# Patient Record
Sex: Male | Born: 1945 | Race: White | Hispanic: No | Marital: Married | Smoking: Current every day smoker
Health system: Southern US, Community
[De-identification: ages and names within clinical notes are randomized; demographics above are authoritative.]

## PROBLEM LIST (undated history)

## (undated) DIAGNOSIS — D469 Myelodysplastic syndrome, unspecified: Secondary | ICD-10-CM

## (undated) DIAGNOSIS — I10 Essential (primary) hypertension: Secondary | ICD-10-CM

## (undated) DIAGNOSIS — I429 Cardiomyopathy, unspecified: Secondary | ICD-10-CM

## (undated) DIAGNOSIS — I482 Chronic atrial fibrillation, unspecified: Secondary | ICD-10-CM

## (undated) DIAGNOSIS — C439 Malignant melanoma of skin, unspecified: Secondary | ICD-10-CM

## (undated) DIAGNOSIS — E119 Type 2 diabetes mellitus without complications: Secondary | ICD-10-CM

---

## 2016-12-30 ENCOUNTER — Inpatient Hospital Stay: Payer: Managed Care, Other (non HMO)

## 2016-12-30 ENCOUNTER — Other Ambulatory Visit: Payer: Self-pay

## 2016-12-30 ENCOUNTER — Emergency Department: Payer: Managed Care, Other (non HMO)

## 2016-12-30 ENCOUNTER — Inpatient Hospital Stay
Admission: EM | Admit: 2016-12-30 | Discharge: 2017-01-12 | DRG: 870 | Disposition: E | Payer: Managed Care, Other (non HMO) | Attending: Internal Medicine | Admitting: Internal Medicine

## 2016-12-30 ENCOUNTER — Encounter: Payer: Self-pay | Admitting: Intensive Care

## 2016-12-30 DIAGNOSIS — A419 Sepsis, unspecified organism: Secondary | ICD-10-CM | POA: Diagnosis not present

## 2016-12-30 DIAGNOSIS — R6521 Severe sepsis with septic shock: Secondary | ICD-10-CM | POA: Diagnosis present

## 2016-12-30 DIAGNOSIS — N171 Acute kidney failure with acute cortical necrosis: Secondary | ICD-10-CM | POA: Diagnosis not present

## 2016-12-30 DIAGNOSIS — Z7982 Long term (current) use of aspirin: Secondary | ICD-10-CM

## 2016-12-30 DIAGNOSIS — Z452 Encounter for adjustment and management of vascular access device: Secondary | ICD-10-CM

## 2016-12-30 DIAGNOSIS — K7689 Other specified diseases of liver: Secondary | ICD-10-CM | POA: Diagnosis present

## 2016-12-30 DIAGNOSIS — Z79899 Other long term (current) drug therapy: Secondary | ICD-10-CM

## 2016-12-30 DIAGNOSIS — D61818 Other pancytopenia: Secondary | ICD-10-CM | POA: Diagnosis present

## 2016-12-30 DIAGNOSIS — Z794 Long term (current) use of insulin: Secondary | ICD-10-CM

## 2016-12-30 DIAGNOSIS — D469 Myelodysplastic syndrome, unspecified: Secondary | ICD-10-CM | POA: Diagnosis present

## 2016-12-30 DIAGNOSIS — R7989 Other specified abnormal findings of blood chemistry: Secondary | ICD-10-CM | POA: Diagnosis not present

## 2016-12-30 DIAGNOSIS — B9620 Unspecified Escherichia coli [E. coli] as the cause of diseases classified elsewhere: Secondary | ICD-10-CM

## 2016-12-30 DIAGNOSIS — I4891 Unspecified atrial fibrillation: Secondary | ICD-10-CM

## 2016-12-30 DIAGNOSIS — R7881 Bacteremia: Secondary | ICD-10-CM

## 2016-12-30 DIAGNOSIS — R34 Anuria and oliguria: Secondary | ICD-10-CM | POA: Diagnosis present

## 2016-12-30 DIAGNOSIS — Z66 Do not resuscitate: Secondary | ICD-10-CM | POA: Diagnosis present

## 2016-12-30 DIAGNOSIS — F17290 Nicotine dependence, other tobacco product, uncomplicated: Secondary | ICD-10-CM | POA: Diagnosis present

## 2016-12-30 DIAGNOSIS — Z515 Encounter for palliative care: Secondary | ICD-10-CM | POA: Diagnosis not present

## 2016-12-30 DIAGNOSIS — J9811 Atelectasis: Secondary | ICD-10-CM | POA: Diagnosis present

## 2016-12-30 DIAGNOSIS — A4159 Other Gram-negative sepsis: Secondary | ICD-10-CM | POA: Diagnosis present

## 2016-12-30 DIAGNOSIS — E1165 Type 2 diabetes mellitus with hyperglycemia: Secondary | ICD-10-CM | POA: Diagnosis present

## 2016-12-30 DIAGNOSIS — G9340 Encephalopathy, unspecified: Secondary | ICD-10-CM | POA: Diagnosis present

## 2016-12-30 DIAGNOSIS — I429 Cardiomyopathy, unspecified: Secondary | ICD-10-CM | POA: Diagnosis present

## 2016-12-30 DIAGNOSIS — R17 Unspecified jaundice: Secondary | ICD-10-CM

## 2016-12-30 DIAGNOSIS — N170 Acute kidney failure with tubular necrosis: Secondary | ICD-10-CM | POA: Diagnosis present

## 2016-12-30 DIAGNOSIS — E875 Hyperkalemia: Secondary | ICD-10-CM | POA: Diagnosis present

## 2016-12-30 DIAGNOSIS — I9589 Other hypotension: Secondary | ICD-10-CM | POA: Diagnosis not present

## 2016-12-30 DIAGNOSIS — E872 Acidosis: Secondary | ICD-10-CM | POA: Diagnosis present

## 2016-12-30 DIAGNOSIS — R6520 Severe sepsis without septic shock: Secondary | ICD-10-CM

## 2016-12-30 DIAGNOSIS — I482 Chronic atrial fibrillation, unspecified: Secondary | ICD-10-CM

## 2016-12-30 DIAGNOSIS — I110 Hypertensive heart disease with heart failure: Secondary | ICD-10-CM | POA: Diagnosis present

## 2016-12-30 DIAGNOSIS — L8990 Pressure ulcer of unspecified site, unspecified stage: Secondary | ICD-10-CM | POA: Insufficient documentation

## 2016-12-30 DIAGNOSIS — N390 Urinary tract infection, site not specified: Secondary | ICD-10-CM | POA: Diagnosis present

## 2016-12-30 DIAGNOSIS — N179 Acute kidney failure, unspecified: Secondary | ICD-10-CM | POA: Diagnosis not present

## 2016-12-30 DIAGNOSIS — I509 Heart failure, unspecified: Secondary | ICD-10-CM | POA: Diagnosis present

## 2016-12-30 DIAGNOSIS — Z22322 Carrier or suspected carrier of Methicillin resistant Staphylococcus aureus: Secondary | ICD-10-CM

## 2016-12-30 DIAGNOSIS — R0603 Acute respiratory distress: Secondary | ICD-10-CM | POA: Diagnosis not present

## 2016-12-30 DIAGNOSIS — J9601 Acute respiratory failure with hypoxia: Secondary | ICD-10-CM | POA: Diagnosis present

## 2016-12-30 DIAGNOSIS — K760 Fatty (change of) liver, not elsewhere classified: Secondary | ICD-10-CM | POA: Diagnosis present

## 2016-12-30 DIAGNOSIS — Z7901 Long term (current) use of anticoagulants: Secondary | ICD-10-CM

## 2016-12-30 DIAGNOSIS — J9600 Acute respiratory failure, unspecified whether with hypoxia or hypercapnia: Secondary | ICD-10-CM

## 2016-12-30 DIAGNOSIS — Z85820 Personal history of malignant melanoma of skin: Secondary | ICD-10-CM

## 2016-12-30 HISTORY — DX: Myelodysplastic syndrome, unspecified: D46.9

## 2016-12-30 HISTORY — DX: Essential (primary) hypertension: I10

## 2016-12-30 HISTORY — DX: Cardiomyopathy, unspecified: I42.9

## 2016-12-30 HISTORY — DX: Chronic atrial fibrillation, unspecified: I48.20

## 2016-12-30 HISTORY — DX: Type 2 diabetes mellitus without complications: E11.9

## 2016-12-30 HISTORY — DX: Malignant melanoma of skin, unspecified: C43.9

## 2016-12-30 LAB — BLOOD GAS, ARTERIAL
ALLENS TEST (PASS/FAIL): POSITIVE — AB
Acid-base deficit: 3.2 mmol/L — ABNORMAL HIGH (ref 0.0–2.0)
BICARBONATE: 20.6 mmol/L (ref 20.0–28.0)
MECHVT: 500 mL
O2 Saturation: 99.6 %
PATIENT TEMPERATURE: 37
PEEP/CPAP: 5 cmH2O
PO2 ART: 174 mmHg — AB (ref 83.0–108.0)
RATE: 20 resp/min
pCO2 arterial: 31 mmHg — ABNORMAL LOW (ref 32.0–48.0)
pH, Arterial: 7.43 (ref 7.350–7.450)

## 2016-12-30 LAB — URINALYSIS, COMPLETE (UACMP) WITH MICROSCOPIC
Glucose, UA: 50 mg/dL — AB
KETONES UR: NEGATIVE mg/dL
Leukocytes, UA: NEGATIVE
Nitrite: NEGATIVE
PROTEIN: 100 mg/dL — AB
Specific Gravity, Urine: 1.016 (ref 1.005–1.030)
Squamous Epithelial / LPF: NONE SEEN
pH: 5 (ref 5.0–8.0)

## 2016-12-30 LAB — INFLUENZA PANEL BY PCR (TYPE A & B)
INFLAPCR: NEGATIVE
Influenza B By PCR: NEGATIVE

## 2016-12-30 LAB — BLOOD GAS, VENOUS
ACID-BASE DEFICIT: 1.8 mmol/L (ref 0.0–2.0)
BICARBONATE: 23.1 mmol/L (ref 20.0–28.0)
PATIENT TEMPERATURE: 37
pCO2, Ven: 39 mmHg — ABNORMAL LOW (ref 44.0–60.0)
pH, Ven: 7.38 (ref 7.250–7.430)
pO2, Ven: <31 mmHg — CL (ref 32.0–45.0)

## 2016-12-30 LAB — LACTIC ACID, PLASMA
LACTIC ACID, VENOUS: 3.1 mmol/L — AB (ref 0.5–1.9)
LACTIC ACID, VENOUS: 2.5 mmol/L — AB (ref 0.5–1.9)

## 2016-12-30 LAB — COMPREHENSIVE METABOLIC PANEL
ALT: 497 U/L — ABNORMAL HIGH (ref 17–63)
ANION GAP: 11 (ref 5–15)
AST: 106 U/L — ABNORMAL HIGH (ref 15–41)
Albumin: 2.5 g/dL — ABNORMAL LOW (ref 3.5–5.0)
Alkaline Phosphatase: 71 U/L (ref 38–126)
BUN: 65 mg/dL — ABNORMAL HIGH (ref 6–20)
CALCIUM: 8.6 mg/dL — AB (ref 8.9–10.3)
CO2: 22 mmol/L (ref 22–32)
Chloride: 99 mmol/L — ABNORMAL LOW (ref 101–111)
Creatinine, Ser: 1.49 mg/dL — ABNORMAL HIGH (ref 0.61–1.24)
GFR calc non Af Amer: 46 mL/min — ABNORMAL LOW (ref >60)
GFR, EST AFRICAN AMERICAN: 53 mL/min — AB (ref >60)
Glucose, Bld: 251 mg/dL — ABNORMAL HIGH (ref 65–99)
Potassium: 4.7 mmol/L (ref 3.5–5.1)
SODIUM: 132 mmol/L — AB (ref 135–145)
Total Bilirubin: 18.9 mg/dL (ref 0.3–1.2)
Total Protein: 5.8 g/dL — ABNORMAL LOW (ref 6.5–8.1)

## 2016-12-30 LAB — LIPASE, BLOOD: LIPASE: 18 U/L (ref 11–51)

## 2016-12-30 LAB — TYPE AND SCREEN
ABO/RH(D): A POS
ANTIBODY SCREEN: NEGATIVE

## 2016-12-30 LAB — GLUCOSE, CAPILLARY
GLUCOSE-CAPILLARY: 269 mg/dL — AB (ref 65–99)
GLUCOSE-CAPILLARY: 321 mg/dL — AB (ref 65–99)

## 2016-12-30 LAB — PROTIME-INR
INR: 2.4
Prothrombin Time: 26.6 seconds — ABNORMAL HIGH (ref 11.4–15.2)

## 2016-12-30 LAB — BILIRUBIN, DIRECT: BILIRUBIN DIRECT: 9.7 mg/dL — AB (ref 0.1–0.5)

## 2016-12-30 LAB — TROPONIN I
Troponin I: <0.03 ng/mL (ref <0.03)
Troponin I: <0.03 ng/mL (ref <0.03)

## 2016-12-30 LAB — PROCALCITONIN
PROCALCITONIN: 7.35 ng/mL
Procalcitonin: 4.74 ng/mL

## 2016-12-30 LAB — PATHOLOGIST SMEAR REVIEW

## 2016-12-30 LAB — BRAIN NATRIURETIC PEPTIDE: B Natriuretic Peptide: 2471 pg/mL — ABNORMAL HIGH (ref 0.0–100.0)

## 2016-12-30 MED ORDER — FENTANYL 2500MCG IN NS 250ML (10MCG/ML) PREMIX INFUSION
25.0000 ug/h | INTRAVENOUS | Status: DC
  Administered 2016-12-30 – 2016-12-31 (×2): 100 ug/h via INTRAVENOUS
  Administered 2017-01-02: 400 ug/h via INTRAVENOUS
  Administered 2017-01-02: 125 ug/h via INTRAVENOUS
  Administered 2017-01-03 – 2017-01-05 (×8): 400 ug/h via INTRAVENOUS
  Filled 2016-12-30 (×13): qty 250

## 2016-12-30 MED ORDER — SODIUM CHLORIDE 0.9 % IV SOLN
0.0000 ug/min | INTRAVENOUS | Status: DC
  Administered 2016-12-30 – 2016-12-31 (×2): 65 ug/min via INTRAVENOUS
  Administered 2016-12-31: 70 ug/min via INTRAVENOUS
  Administered 2017-01-01: 75 ug/min via INTRAVENOUS
  Administered 2017-01-01 (×2): 80 ug/min via INTRAVENOUS
  Filled 2016-12-30 (×8): qty 4

## 2016-12-30 MED ORDER — SODIUM CHLORIDE 0.9 % IV SOLN
250.0000 mL | INTRAVENOUS | Status: DC | PRN
  Administered 2016-12-30: 1000 mL via INTRAVENOUS

## 2016-12-30 MED ORDER — ACETAMINOPHEN 650 MG RE SUPP
650.0000 mg | Freq: Once | RECTAL | Status: AC
  Administered 2016-12-30: 650 mg via RECTAL
  Filled 2016-12-30: qty 1

## 2016-12-30 MED ORDER — AMIODARONE HCL IN DEXTROSE 360-4.14 MG/200ML-% IV SOLN
60.0000 mg/h | INTRAVENOUS | Status: DC
  Administered 2016-12-30 – 2017-01-02 (×7): 30 mg/h via INTRAVENOUS
  Administered 2017-01-02: 60 mg/h via INTRAVENOUS
  Administered 2017-01-02: 30 mg/h via INTRAVENOUS
  Administered 2017-01-03 – 2017-01-05 (×9): 60 mg/h via INTRAVENOUS
  Filled 2016-12-30 (×16): qty 200

## 2016-12-30 MED ORDER — HYDROCORTISONE NA SUCCINATE PF 100 MG IJ SOLR
100.0000 mg | Freq: Three times a day (TID) | INTRAMUSCULAR | Status: DC
  Administered 2016-12-30 – 2017-01-05 (×17): 100 mg via INTRAVENOUS
  Filled 2016-12-30 (×17): qty 2

## 2016-12-30 MED ORDER — HYDROCORTISONE NA SUCCINATE PF 100 MG IJ SOLR: 50.0000 mg | Freq: Four times a day (QID) | INTRAMUSCULAR | Status: DC

## 2016-12-30 MED ORDER — SODIUM CHLORIDE 0.9 % IV SOLN
0.0000 ug/min | INTRAVENOUS | Status: DC
  Administered 2016-12-30: 75 ug/min via INTRAVENOUS
  Administered 2016-12-30: 20 ug/min via INTRAVENOUS
  Administered 2016-12-30: 65 ug/min via INTRAVENOUS
  Filled 2016-12-30 (×4): qty 1

## 2016-12-30 MED ORDER — PROPOFOL 1000 MG/100ML IV EMUL
5.0000 ug/kg/min | Freq: Once | INTRAVENOUS | Status: AC
  Administered 2016-12-30: 5 ug/kg/min via INTRAVENOUS
  Filled 2016-12-30: qty 100

## 2016-12-30 MED ORDER — SODIUM BICARBONATE 8.4 % IV SOLN
150.0000 meq | Freq: Once | INTRAVENOUS | Status: AC
  Administered 2016-12-30: 150 meq via INTRAVENOUS
  Filled 2016-12-30: qty 150

## 2016-12-30 MED ORDER — FENTANYL CITRATE (PF) 100 MCG/2ML IJ SOLN: 50.0000 ug | Freq: Once | INTRAMUSCULAR | Status: DC

## 2016-12-30 MED ORDER — FENTANYL CITRATE (PF) 100 MCG/2ML IJ SOLN: 50.0000 ug | INTRAMUSCULAR | Status: DC | PRN

## 2016-12-30 MED ORDER — CHLORHEXIDINE GLUCONATE 0.12% ORAL RINSE (MEDLINE KIT)
15.0000 mL | Freq: Two times a day (BID) | OROMUCOSAL | Status: DC
  Administered 2016-12-30 – 2017-01-01 (×5): 15 mL via OROMUCOSAL

## 2016-12-30 MED ORDER — NOREPINEPHRINE BITARTRATE 1 MG/ML IV SOLN
0.0000 ug/min | INTRAVENOUS | Status: AC
  Administered 2016-12-30: 30 ug/min via INTRAVENOUS
  Filled 2016-12-30: qty 4

## 2016-12-30 MED ORDER — SODIUM CHLORIDE 0.9 % IV SOLN
25.0000 ug/h | INTRAVENOUS | Status: DC
  Filled 2016-12-30: qty 50

## 2016-12-30 MED ORDER — AMIODARONE HCL IN DEXTROSE 360-4.14 MG/200ML-% IV SOLN
60.0000 mg/h | INTRAVENOUS | Status: AC
  Administered 2016-12-30: 60 mg/h via INTRAVENOUS
  Filled 2016-12-30 (×2): qty 200

## 2016-12-30 MED ORDER — FENTANYL CITRATE (PF) 100 MCG/2ML IJ SOLN
50.0000 ug | INTRAMUSCULAR | Status: DC | PRN
  Administered 2016-12-31: 50 ug via INTRAVENOUS

## 2016-12-30 MED ORDER — FAMOTIDINE IN NACL 20-0.9 MG/50ML-% IV SOLN: 20.0000 mg | Freq: Two times a day (BID) | INTRAVENOUS | Status: DC

## 2016-12-30 MED ORDER — AMIODARONE IV BOLUS ONLY 150 MG/100ML
150.0000 mg | Freq: Once | INTRAVENOUS | Status: AC
  Administered 2016-12-30: 150 mg via INTRAVENOUS

## 2016-12-30 MED ORDER — KETAMINE HCL 10 MG/ML IJ SOLN
150.0000 mg | Freq: Once | INTRAMUSCULAR | Status: AC
  Administered 2016-12-30: 150 mg via INTRAVENOUS

## 2016-12-30 MED ORDER — DEXTROSE 5 % IV SOLN
2.0000 g | Freq: Two times a day (BID) | INTRAVENOUS | Status: DC
  Administered 2016-12-30: 2 g via INTRAVENOUS
  Filled 2016-12-30 (×3): qty 2

## 2016-12-30 MED ORDER — DOPAMINE-DEXTROSE 3.2-5 MG/ML-% IV SOLN
INTRAVENOUS | Status: AC
  Filled 2016-12-30: qty 250

## 2016-12-30 MED ORDER — ACETAMINOPHEN 160 MG/5ML PO SOLN: 1000.0000 mg | Freq: Once | ORAL | Status: DC

## 2016-12-30 MED ORDER — VANCOMYCIN HCL IN DEXTROSE 1-5 GM/200ML-% IV SOLN
1000.0000 mg | Freq: Once | INTRAVENOUS | Status: AC
  Administered 2016-12-30: 1000 mg via INTRAVENOUS
  Filled 2016-12-30: qty 200

## 2016-12-30 MED ORDER — SODIUM CHLORIDE 0.9 % IV BOLUS (SEPSIS)
1000.0000 mL | Freq: Once | INTRAVENOUS | Status: AC
  Administered 2016-12-30: 1000 mL via INTRAVENOUS

## 2016-12-30 MED ORDER — FENTANYL BOLUS VIA INFUSION
25.0000 ug | INTRAVENOUS | Status: DC | PRN
  Administered 2017-01-02 – 2017-01-03 (×3): 25 ug via INTRAVENOUS
  Filled 2016-12-30: qty 25

## 2016-12-30 MED ORDER — AMIODARONE LOAD VIA INFUSION
150.0000 mg | Freq: Once | INTRAVENOUS | Status: DC
  Filled 2016-12-30: qty 83.34

## 2016-12-30 MED ORDER — VASOPRESSIN 20 UNIT/ML IV SOLN
0.0300 [IU]/min | INTRAVENOUS | Status: DC
  Administered 2016-12-30 – 2017-01-04 (×6): 0.03 [IU]/min via INTRAVENOUS
  Filled 2016-12-30 (×7): qty 2

## 2016-12-30 MED ORDER — SODIUM CHLORIDE 0.9 % IV BOLUS (SEPSIS)
500.0000 mL | Freq: Once | INTRAVENOUS | Status: AC
  Administered 2016-12-30: 500 mL via INTRAVENOUS

## 2016-12-30 MED ORDER — NOREPINEPHRINE BITARTRATE 1 MG/ML IV SOLN
0.0000 ug/min | Freq: Once | INTRAVENOUS | Status: AC
  Administered 2016-12-30: 10 ug/min via INTRAVENOUS
  Filled 2016-12-30: qty 4

## 2016-12-30 MED ORDER — SODIUM CHLORIDE 0.9 % IV SOLN
100.0000 ug/h | INTRAVENOUS | Status: DC
  Filled 2016-12-30: qty 50

## 2016-12-30 MED ORDER — NOREPINEPHRINE BITARTRATE 1 MG/ML IV SOLN
0.0000 ug/min | INTRAVENOUS | Status: DC
  Administered 2016-12-30 – 2016-12-31 (×2): 6 ug/min via INTRAVENOUS
  Administered 2017-01-02: 18 ug/min via INTRAVENOUS
  Administered 2017-01-02: 8 ug/min via INTRAVENOUS
  Administered 2017-01-03: 5 ug/min via INTRAVENOUS
  Filled 2016-12-30 (×6): qty 16

## 2016-12-30 MED ORDER — CEFEPIME-DEXTROSE 2 GM/50ML IV SOLR
2.0000 g | Freq: Once | INTRAVENOUS | Status: AC
  Administered 2016-12-30: 2 g via INTRAVENOUS
  Filled 2016-12-30: qty 50

## 2016-12-30 MED ORDER — PROPOFOL 1000 MG/100ML IV EMUL
5.0000 ug/kg/min | INTRAVENOUS | Status: DC
  Administered 2016-12-30: 10 ug/kg/min via INTRAVENOUS
  Administered 2016-12-31: 12 ug/kg/min via INTRAVENOUS
  Administered 2016-12-31: 10 ug/kg/min via INTRAVENOUS
  Administered 2017-01-01 (×2): 15 ug/kg/min via INTRAVENOUS
  Administered 2017-01-02: 20 ug/kg/min via INTRAVENOUS
  Filled 2016-12-30 (×6): qty 100

## 2016-12-30 MED ORDER — INSULIN ASPART 100 UNIT/ML ~~LOC~~ SOLN
0.0000 [IU] | SUBCUTANEOUS | Status: DC
  Administered 2016-12-30: 5 [IU] via SUBCUTANEOUS
  Administered 2016-12-30: 7 [IU] via SUBCUTANEOUS
  Administered 2016-12-31 (×6): 5 [IU] via SUBCUTANEOUS
  Administered 2017-01-01 (×3): 3 [IU] via SUBCUTANEOUS
  Administered 2017-01-01 (×2): 5 [IU] via SUBCUTANEOUS
  Administered 2017-01-01: 3 [IU] via SUBCUTANEOUS
  Filled 2016-12-30: qty 3
  Filled 2016-12-30 (×2): qty 5
  Filled 2016-12-30: qty 3
  Filled 2016-12-30 (×2): qty 5
  Filled 2016-12-30: qty 3
  Filled 2016-12-30 (×3): qty 5
  Filled 2016-12-30: qty 7
  Filled 2016-12-30 (×2): qty 5
  Filled 2016-12-30: qty 3

## 2016-12-30 MED ORDER — ORAL CARE MOUTH RINSE
15.0000 mL | Freq: Four times a day (QID) | OROMUCOSAL | Status: DC
  Administered 2016-12-31 – 2017-01-01 (×8): 15 mL via OROMUCOSAL

## 2016-12-30 MED ORDER — ROCURONIUM BROMIDE 50 MG/5ML IV SOLN
100.0000 mg | Freq: Once | INTRAVENOUS | Status: AC
  Administered 2016-12-30: 100 mg via INTRAVENOUS
  Filled 2016-12-30: qty 10

## 2016-12-30 MED ORDER — VANCOMYCIN HCL IN DEXTROSE 1-5 GM/200ML-% IV SOLN
1000.0000 mg | Freq: Two times a day (BID) | INTRAVENOUS | Status: DC
  Administered 2016-12-30 – 2016-12-31 (×2): 1000 mg via INTRAVENOUS
  Filled 2016-12-30 (×3): qty 200

## 2016-12-30 MED ORDER — KETAMINE HCL 10 MG/ML IJ SOLN
INTRAMUSCULAR | Status: AC
  Administered 2016-12-30: 150 mg via INTRAVENOUS
  Filled 2016-12-30: qty 1

## 2016-12-30 NOTE — Progress Notes (Signed)
Sepsis - Repeat Assessment  Performed at:   16:20   Vitals     Blood pressure 103/78, pulse (!) 113, temperature 99 F (37.2 C), resp. rate 20, height 6\' 1"  (1.854 m), weight 89.4 kg (197 lb), SpO2 99 %.  Heart:     Irregular rate and rhythm  Lungs:    Rhonchi  Capillary Refill:   <2 sec  Peripheral Pulse:   Radial pulse palpable, Dorsalis pedis pulse  dopplerable and Posterior tibialis pulse  dopplerable  Skin:     jaundiced     Sepsis protocol initiated in the ER pt received 3.5L NS bolus in the ER.  Will repeat lactic acid if lactic acid remains elevated will not fluid resuscitate due to cxr revealing severe pulmonary edema and cardiomegaly continue broad spectrum abx.  Marda Stalker, [address] Pager [phone] (please enter 7 digits) PCCM Consult Pager [phone] (please enter 7 digits)

## 2016-12-30 NOTE — ED Triage Notes (Signed)
PAtient [facility] Patient is from [place] and checked himself out the hospital there on Tuesday to come to [place] and watch grandson play ball. Patient arrived into town last night. Wife reports patient became confused on Wednesday and reports he has not eaten since Sunday. HX MDS and A-fib. EMS vitals temp 101, tachy 130s, and blood sugar 307. A&O only to self

## 2016-12-30 NOTE — ED Notes (Signed)
Unable to obtain tni/lact. NP Hinton Dyer aware

## 2016-12-30 NOTE — Progress Notes (Signed)
[facility] Progress Note Patient Name: Xavier King DOB: Jun 29, 1946 MRN: [id number]   Date of Service  12/21/2016  HPI/Events of Note  Patient needs repeat Lactic Acid level for sepsis protocol and volume assessment note.   eICU Interventions  Will order: 1. Lactic Acid now and Q 3 hours X 2.  2. [facility] APP requested to do volume assessment note.      Intervention Category Major Interventions: Sepsis - evaluation and management  Carmelo Reidel Eugene 12/21/2016, 6:10 PM

## 2016-12-30 NOTE — ED Notes (Signed)
Close communication kept with CC Doctor and NP during stay in ED. Patient Stable at time of transfer. Accepting RN at bedside.   Detailed report given to CCU RN. CCU RN voiced no concerns or additional questions.   Patient transition to ICU without complication.

## 2016-12-30 NOTE — ED Notes (Signed)
Contacted Pharm for Neo and  Amio, awaiting pharm approval and shipment

## 2016-12-30 NOTE — ED Provider Notes (Signed)
[facility] Emergency Department Provider Note  ____________________________________________   First MD Initiated Contact with Patient 12/22/2016 1021     (approximate)  I have reviewed the triage vital signs and the nursing notes.   HISTORY  Chief Complaint Altered Mental Status  History Limited by the patient's mental status  HPI Xavier King is a 71 y.o. male is brought to the emergency department with his wife via EMS for altered mental status and shortness of breath. He has a past medical history of myelodysplastic syndrome who was recently admitted to his home hospital in [place] one week ago. He received a blood transfusion and was discharged [place] because he wanted to come to [place] to see his grandson playing a baseball game. His wife reports increasing confusion and shortness of breath for the past 2 days. Further history is limited by the patient's clinical condition.   Past Medical History:  Diagnosis Date  . Atrial fibrillation ([place])   . Diabetes mellitus without complication ([place])   . Hypertension   . MDS (myelodysplastic syndrome) ([place])   . Melanoma ([place])     There are no active problems to display for this patient.   History reviewed. No pertinent surgical history.  Prior to Admission medications   Medication Sig Start Date End Date Taking? Authorizing Provider  acetaminophen (TYLENOL) 325 MG tablet Take 650 mg by mouth every 6 (six) hours as needed.   Yes Historical Provider, MD  alum & mag hydroxide-simeth (MAALOX/MYLANTA) 200-200-20 MG/5ML suspension Take 30 mLs by mouth every 6 (six) hours as needed for indigestion or heartburn.   Yes Historical Provider, MD  apixaban (ELIQUIS) 5 MG TABS tablet Take 5 mg by mouth 2 (two) times daily.   Yes Historical Provider, MD  aspirin EC 81 MG tablet Take 81 mg by mouth daily.   Yes Historical Provider, MD  atorvastatin (LIPITOR) 10 MG tablet Take 10 mg by  mouth daily.   Yes Historical Provider, MD  calcium carbonate (TUMS - DOSED IN MG ELEMENTAL CALCIUM) 500 MG chewable tablet Chew 1 tablet by mouth 4 (four) times daily as needed for indigestion or heartburn.   Yes Historical Provider, MD  cyanocobalamin 1000 MCG tablet Take 1,000 mcg by mouth daily.   Yes Historical Provider, MD  diltiazem (DILTIAZEM CD) 120 MG 24 hr capsule Take 120 mg by mouth daily.   Yes Historical Provider, MD  glyBURIDE (DIABETA) 5 MG tablet Take 10 mg by mouth 2 (two) times daily with a meal.   Yes Historical Provider, MD  guaiFENesin-codeine (VIRTUSSIN A/C) 100-10 MG/5ML syrup Take 10 mLs by mouth 3 (three) times daily as needed for cough.   Yes Historical Provider, MD  insulin aspart (NOVOLOG) 100 UNIT/ML injection Inject into the skin 3 (three) times daily before meals. Sliding scale   Yes Historical Provider, MD  Melatonin 3 MG TABS Take 6 mg by mouth at bedtime as needed.   Yes Historical Provider, MD  metoprolol succinate (TOPROL-XL) 100 MG 24 hr tablet Take 100 mg by mouth daily. Take with or immediately following a meal.   Yes Historical Provider, MD  senna-docusate (SENOKOT-S) 8.6-50 MG tablet Take 1 tablet by mouth 2 (two) times daily as needed for mild constipation.   Yes Historical Provider, MD    Allergies Patient has no known allergies.  History reviewed. No pertinent family history.  Social History Social History  Substance Use Topics  . Smoking status: Current Every Day Smoker  Types: Cigars  . Smokeless tobacco: Never Used     Comment: 1 cigar a day  . Alcohol use No    Review of Systems Level V caveat review of systems cannot be obtained secondary to altered mental status   ____________________________________________   PHYSICAL EXAM:  VITAL SIGNS: ED Triage Vitals [12/29/2016 1016]  Enc Vitals Group     BP 124/81     Pulse Rate (!) 145     Resp 19     Temp 99.8 F (37.7 C)     Temp Source Axillary     SpO2 100 %     Weight       Height      Head Circumference      Peak Flow      Pain Score      Pain Loc      Pain Edu?      Excl. in [place]?     Constitutional: Ill-appearing slight diaphoresis breathing in the 40s confused Eyes: Significant icterus Head: Atraumatic. Nose: No congestion/rhinnorhea. Mouth/Throat: Mucous membranes are moist.  Oropharynx non-erythematous. Neck: No stridor.   Cardiovascular: Irregularly irregular and tachycardic Respiratory: Normal respiratory effort.  No retractions. Lungs CTAB. Gastrointestinal: Soft and nontender. No distention. No abdominal bruits. No CVA tenderness. Musculoskeletal: No lower extremity tenderness nor edema.  No joint effusions. Neurologic:  Alert and oriented to name only confused moving all 4 Skin:  Skin is warm, dry and intact. No rash noted. Psychiatric: Mood and affect are normal. Speech and behavior are normal.  ____________________________________________   LABS (all labs ordered are listed, but only abnormal results are displayed)  Labs Reviewed  COMPREHENSIVE METABOLIC PANEL - Abnormal; Notable for the following:       Result Value   Sodium 132 (*)    Chloride 99 (*)    Glucose, Bld 251 (*)    BUN 65 (*)    Creatinine, Ser 1.49 (*)    Calcium 8.6 (*)    Total Protein 5.8 (*)    Albumin 2.5 (*)    AST 106 (*)    ALT 497 (*)    Total Bilirubin 18.9 (*)    GFR calc non Af Amer 46 (*)    GFR calc Af Amer 53 (*)    All other components within normal limits  CBC WITH DIFFERENTIAL/PLATELET - Abnormal; Notable for the following:    WBC 1.1 (*)    RBC 3.01 (*)    Hemoglobin 9.9 (*)    HCT 28.2 (*)    RDW 22.4 (*)    Platelets 91 (*)    nRBC 858 (*)    Neutro Abs 0.7 (*)    Lymphs Abs 0.3 (*)    Monocytes Absolute 0.1 (*)    All other components within normal limits  LACTIC ACID, PLASMA - Abnormal; Notable for the following:    Lactic Acid, Venous 3.1 (*)    All other components within normal limits  BRAIN NATRIURETIC PEPTIDE - Abnormal;  Notable for the following:    B Natriuretic Peptide 2,471.0 (*)    All other components within normal limits  BLOOD GAS, VENOUS - Abnormal; Notable for the following:    pCO2, Ven 39 (*)    pO2, Ven <31.0 (*)    All other components within normal limits  PROTIME-INR - Abnormal; Notable for the following:    Prothrombin Time 26.6 (*)    All other components within normal limits  URINALYSIS, COMPLETE (UACMP) WITH MICROSCOPIC - Abnormal;  Notable for the following:    Color, Urine AMBER (*)    APPearance CLEAR (*)    Glucose, UA 50 (*)    Hgb urine dipstick MODERATE (*)    Bilirubin Urine MODERATE (*)    Protein, ur 100 (*)    Bacteria, UA MANY (*)    All other components within normal limits  BILIRUBIN, DIRECT - Abnormal; Notable for the following:    Bilirubin, Direct 9.7 (*)    All other components within normal limits  BLOOD GAS, ARTERIAL - Abnormal; Notable for the following:    pCO2 arterial 31 (*)    pO2, Arterial 174 (*)    Acid-base deficit 3.2 (*)    Allens test (pass/fail) POSITIVE (*)    All other components within normal limits  CULTURE, BLOOD (ROUTINE X 2)  CULTURE, BLOOD (ROUTINE X 2)  LIPASE, BLOOD  TROPONIN I  PROCALCITONIN  INFLUENZA PANEL BY PCR (TYPE A & B)  PATHOLOGIST SMEAR REVIEW  URINALYSIS, ROUTINE W REFLEX MICROSCOPIC  LACTIC ACID, PLASMA  TYPE AND SCREEN   ____________________________________________  EKG  ED ECG REPORT I, Darel Hong, the attending physician, personally viewed and interpreted this ECG.  Date: 01/06/2017 Rate: 140 Rhythm: Atrial fibrillation with rapid ventricular response QRS Axis: Right axis deviation Intervals: Normal QTC ST/T Wave abnormalities: Diffuse ST depression throughout Conduction Disturbances: none Narrative Interpretation: Abnormal EKG atrial fibrillation with rapid ventricular response with signs of subendocardial ischemia _________________________________________  RADIOLOGY  ET tube slightly high  evidence of mild fluid overload ____________________________________________   PROCEDURES  Procedure(s) performed: Yes  INTUBATION Performed by: Darel Hong  Required items: required blood products, implants, devices, and special equipment available Patient identity confirmed: provided demographic data and hospital-assigned identification number Time out: Immediately prior to procedure a "time out" was called to verify the correct patient, procedure, equipment, support staff and site/side marked as required.  Indications: Altered mental status and sepsis   Intubation method: Glidescope Laryngoscopy   Preoxygenation: High flow nasal cannula   Sedatives: Ketamine  Paralytic: Rocuronium   Tube Size: 8-0 cuffed  Post-procedure assessment: chest rise and ETCO2 monitor Breath sounds: equal and absent over the epigastrium Tube secured with: ETT holder Chest x-ray interpreted by radiologist and me.  Chest x-ray findings: Endotracheal tube slightly high will be advanced 2 cm   Patient tolerated the procedure well with no immediate complications.     Procedures  Critical Care performed: Yes  CRITICAL CARE Performed by: Darel Hong   Total critical care time: 35 minutes  Critical care time was exclusive of separately billable procedures and treating other patients.  Critical care was necessary to treat or prevent imminent or life-threatening deterioration.  Critical care was time spent personally by me on the following activities: development of treatment plan with patient and/or surrogate as well as nursing, discussions with consultants, evaluation of patient's response to treatment, examination of patient, obtaining history from patient or surrogate, ordering and performing treatments and interventions, ordering and review of laboratory studies, ordering and review of radiographic studies, pulse oximetry and re-evaluation of patient's  condition.   ____________________________________________   INITIAL IMPRESSION / ASSESSMENT AND PLAN / ED COURSE  Pertinent labs & imaging results that were available during my care of the patient were reviewed by me and considered in my medical decision making (see chart for details).  On arrival the patient was tachycardic diaphoretic confused and ill appearing. Febrile to 101 quart temperature. Differential is broad but certainly concerning for sepsis and most  likely hospital associated pneumonia given cough and shortness of breath. I discussed the case with the patient's primary care physician in [place] who indicated his last bilirubin was 3 in January. I spoke with the patient's wife who provided verbal consent for endotracheal intubation given the altered mental status. Patient was intubated without complication on first pass with ketamine and rocuronium and broad-spectrum antibiotics given as well as 30 cc/kg of fluid. Case was discussed with the on-call intensivist Dr. Mortimer Fries who has graciously agreed to admit the patient to his service.     ----------------------------------------- 12:37 PM on 12/29/2016 -----------------------------------------  The patient is now hypotensive to 59/40 so we'll bolus him another liter and begin peripheral norepinephrine.  ____________________________________________   FINAL CLINICAL IMPRESSION(S) / ED DIAGNOSES  Final diagnoses:  Severe sepsis ([place])      NEW MEDICATIONS STARTED DURING THIS VISIT:  New Prescriptions   No medications on file     Note:  This document was prepared using Dragon voice recognition software and may include unintentional dictation errors.     Darel Hong, MD 12/23/2016 [phone]

## 2016-12-30 NOTE — ED Notes (Signed)
Close communication

## 2016-12-30 NOTE — H&P (Signed)
PULMONARY / CRITICAL CARE MEDICINE   Name: Xavier King MRN: [id number] DOB: 18-Jan-1946    ADMISSION DATE:  01/10/2017 CONSULTATION DATE:  01/09/2017  REFERRING MD:  Dr. Mable Paris   CHIEF COMPLAINT:  Altered Mental Status   HISTORY OF PRESENT ILLNESS:   This is a 71 yo male with a PMH of Atrial Fibrillation, Diabetes Mellitus, HTN, Myelodysplastic Syndrome, recent Echo 12/2016 EF 30% to 35%, and Melanoma.  He presented to [facility] ER 02/16 via EMS with Altered Mental Status.  Per ER notes the pt is from [place] and he was recently hospitalized there and left AMA on 02/13 to travel to [place] to watch his grandson play ball he arrived to [place] the night of 02/15.  According to pts wife the pt became confused on 02/14 and has not eaten since 02/11.  Upon EMS arrival at the [facility] in [place] pts. vital signs were temp 101, hr 130's, and blood sugar 302 the pt was alert to himself only. In the ER venous gas revealed the pt was severely hypoxic, BNP elevated 2,471, therefore he required mechanical intubation. It was also noted the pt was in afibb with rvr hr 130-140's with lactic acidosis and an elevated PCT ruling in for sepsis, therefore sepsis protocol initiated pt received total 3.5L NS.  PCCM contacted to admit pt to ICU due to acute hypoxic respiratory failure secondary to CHF, ?HCAP with sepsis, and Afibb with RVR.   PAST MEDICAL HISTORY :  He  has a past medical history of Atrial fibrillation ([place]); Diabetes mellitus without complication ([place]); Hypertension; MDS (myelodysplastic syndrome) ([place]); and Melanoma ([place]).  PAST SURGICAL HISTORY: He  has no past surgical history on file.  No Known Allergies  No current facility-administered medications on file prior to encounter.    No current outpatient prescriptions on file prior to encounter.    FAMILY HISTORY:  His has no family status information on file.    SOCIAL HISTORY: He  reports that he has been smoking Cigars.   He has never used smokeless tobacco. He reports that he does not drink alcohol.  REVIEW OF SYSTEMS:   Unable to assess pt intubated  SUBJECTIVE:  Pt currently sedated and intubated   VITAL SIGNS: BP 107/83   Pulse (!) 143   Temp (!) 101.4 F (38.6 C)   Resp 13   Ht 6\' 1"  (1.854 m)   Wt 89.4 kg (197 lb)   SpO2 100%   BMI 25.99 kg/m   HEMODYNAMICS:    VENTILATOR SETTINGS: Vent Mode: AC FiO2 (%):  [70 %] 70 % Set Rate:  [20 bmp] 20 bmp Vt Set:  [500 mL] 500 mL PEEP:  [5 cmH20] 5 cmH20  INTAKE / OUTPUT: No intake/output data recorded.  PHYSICAL EXAMINATION: General: acutely ill Caucasian male  Neuro: sedated not followings commands, PERRLA  HEENT: supple, mild JVD Cardiovascular: irregular irregular, no M/R/G Lungs: rhonchi and crackles throughout, labored, mechanically intubated  Abdomen: +BS x4, mildly distended, soft  Musculoskeletal: 1+ bilateral lower extremity pitting edema  Skin: generalized jaundice   LABS:  BMET  Recent Labs Lab 12/29/2016 1031  NA 132*  K 4.7  CL 99*  CO2 22  BUN 65*  CREATININE 1.49*  GLUCOSE 251*    Electrolytes  Recent Labs Lab 12/22/2016 1031  CALCIUM 8.6*    CBC  Recent Labs Lab 12/27/2016 1031  WBC 1.1*  HGB 9.9*  HCT 28.2*  PLT 91*    Coag's  Recent Labs Lab 01/06/2017 1031  INR 2.40    Sepsis Markers  Recent Labs Lab 12/20/2016 1023 12/15/2016 1031  LATICACIDVEN 3.1*  --   PROCALCITON  --  4.74    ABG  Recent Labs Lab 12/18/2016 1208  PHART 7.43  PCO2ART 31*  PO2ART 174*    Liver Enzymes  Recent Labs Lab 12/27/2016 1031  AST 106*  ALT 497*  ALKPHOS 71  BILITOT 18.9*  ALBUMIN 2.5*    Cardiac Enzymes  Recent Labs Lab 12/26/2016 1031  TROPONINI <0.03    Glucose No results for input(s): GLUCAP in the last 168 hours.  Imaging Dg Abdomen 1 View  Result Date: 12/31/2016 CLINICAL DATA:  Status post OG tube placement EXAM: ABDOMEN - 1 VIEW COMPARISON:  None. FINDINGS: Gastric  catheter lies at the level of the gastroesophageal junction. Proximal side port is within the distal esophagus. This should be advanced as clinically indicated. Left renal calculus is noted. No obstructive changes are seen. IMPRESSION: Gastric catheter at the GE junction as described. Left renal calculus. Electronically Signed   By: Inez Catalina M.D.   On: 12/29/2016 11:41   Ct Head Wo Contrast  Result Date: 12/16/2016 CLINICAL DATA:  Episode of confusion since Wednesday, history myelodysplastic syndrome, atrial fibrillation, hypertension, diabetes mellitus, prior melanoma EXAM: CT HEAD WITHOUT CONTRAST TECHNIQUE: Contiguous axial images were obtained from the base of the skull through the vertex without intravenous contrast. Sagittal and coronal MPR images reconstructed from axial data set. COMPARISON:  None FINDINGS: Brain: Generalized atrophy. Normal ventricular morphology. No midline shift or mass effect. Minimal small vessel chronic ischemic changes of deep cerebral white matter. No intracranial hemorrhage, mass lesion, evidence of acute infarction, or extra-axial fluid collection. Vascular: Atherosclerotic calcifications at the carotid arteries and vertebral Skull: Demineralized but intact Sinuses/Orbits: Mucosal thickening in ethmoid air cells. Remaining paranasal sinuses and mastoid air cells clear. Other: N/A IMPRESSION: Atrophy with minimal small vessel chronic ischemic changes of deep cerebral white matter. No acute intracranial abnormalities. Electronically Signed   By: Lavonia Dana M.D.   On: 12/16/2016 12:17   Dg Chest Port 1 View  Result Date: 01/01/2017 CLINICAL DATA:  Intubated.  Cough. EXAM: PORTABLE CHEST 1 VIEW COMPARISON:  12/25/2016 FINDINGS: There is an endotracheal tube with the tip 5.8 cm above the carina. There is no pleural effusion or pneumothorax. There is no focal consolidation. There is mild cardiomegaly. There is a nasogastric tube coursing below the diaphragm. The osseous  structures are unremarkable. IMPRESSION: Endotracheal tube with the tip 5.8 cm above the carina. Electronically Signed   By: Kathreen Devoid   On: 12/17/2016 11:38   STUDIES:  CT Head 02/16>>Atrophy with minimal small vessel chronic ischemic changes of deep cerebral white matter. No acute intracranial abnormalities.  CULTURES: Blood 02/16>> Urine 02/16>> Influenza PCR>>negative   ANTIBIOTICS: Vancomycin 02/16>> Cefepime 02/16>>  SIGNIFICANT EVENTS: 02/16-Pt admitted to [facility] ICU with acute hypoxic respiratory failure secondary to CHF, ?HCAP with sepsis, and Afibb with RVR PCCM contacted to admit pt  LINES/TUBES: PIV's 02/16>>  ASSESSMENT / PLAN:  PULMONARY A: Acute hypoxic respiratory failure secondary to CHF and ?HCAP with sepsis Mechanical Intubation P:   Full vent support for now  Maintain O2 sats >92% VAP Bundle Repeat ABG today 02/16 CXR in am   CARDIOVASCULAR A:  Afibb with RVR Septic shock  CHF Cardiomegaly  Hx: Chronic afibb (current outpatient anticoagulation Eliquis) and HTN  P:  Continuous telemetry monitoring  Neo-synephrine gtt to maintain map goal >65 Amiodarone gtt  Cardiology consulted  appreciate input Trend troponin's  Will hold off on diuresis for now due to hypotension  RENAL A:   Acute renal failure likely secondary to hypotension P:   Trend BMP's Replace electrolytes as indicated  Monitor UOP  GASTROINTESTINAL A:   Elevated Bilirubin  Elevated Liver Enzymes P:   Pecid for PUD prophylaxis  Keep NPO for now  OG tube to LIS GI consult pending appreciate input  US Abdomen Limited RUQ pending Repeat liver enzyme and bilirubin levels in the am   HEMATOLOGIC A:   Thrombocytopenia Neutropenia secondary to MDS  Hx: Melanoma and Myelodysplastic syndrome  P:  SCD's for VTE prophylaxis will hold off on mechanical anticoagulation for now  Trend CBC Monitor for s/sx of bleeding Transfuse for hgb <7 Neutropenic  precautions  INFECTIOUS A:   ?HCAP  P:   Trend WBC and monitor fever curve Trend lactic acid and PCT's Follow cultures  Continue abx as listed above   ENDOCRINE A:   Diabetes Mellitus P:   CBG's q4hrs SSI   NEUROLOGIC A:   Acute encephalopathy likely secondary to sepsis  P:   RASS goal: 0 to -1 Propofol gtt and Fentanyl gtt to maintain RASS goal  Prn Fentanyl for pain management   FAMILY  - Updates: Family at bedside and updated regarding plan of care 12/19/2016  - Inter-disciplinary family meet or Palliative Care meeting due by: 2017/01/22  Marda Stalker, [place] Pager [phone] (please enter 7 digits) PCCM Consult Pager [phone] (please enter 7 digits)  Pulmonary/critical care attending note  I have personally seen and examined Mr. Teague, I have reviewed revise and confirm Cindie Laroche note. I have personally reviewed laboratory data and x-ray imaging. In short patient with myelodysplastic syndrome presents with multisystem organ failure to include respiratory failure, renal failure, shock, atrial fibrillation with rapid ventricular response, prior history of congestive heart failure. Patient is intubated, will place on broad-spectrum antibiotic coverage, amiodarone bolus and drip, will start on Neo-Synephrine and may need to add vasopressin in addition. Will obtain echocardiography, cardiology consultation, gastroenterology consultation, repeat liver function tests with right upper quadrant ultrasound to explain the conjugated hyperbilirubinemia. Family is at bedside and has been updated  Hermelinda Dellen, D.O.

## 2016-12-30 NOTE — Progress Notes (Signed)
Patient vitals stable at this time- weaning off pressors as possible.  Levophed now at 43mcqs and neo-synpherine at 68mcqs- HR afib in low 100's.  Notified Dr. Jefferson Fuel that patient's pupils are unequal- right is a 2 and brisk and left is 3 and unreactive- he stated that the patient is too unstable to send for CT of the head- and to relay to the night RN if patient is more stable during the night- that they can send patient for CT scan.

## 2016-12-30 NOTE — ED Notes (Signed)
Marily Memos, NP at bedside, verbal order given to start levophed at 10 mcg/min

## 2016-12-30 NOTE — Procedures (Signed)
Central Venous Catheter Insertion Procedure Note Neylan Landfair [id number] 1946/04/04  Procedure: Insertion of Central Venous Catheter Indications: Assessment of intravascular volume, Drug and/or fluid administration and Frequent blood sampling  Procedure Details Consent: Risks of procedure as well as the alternatives and risks of each were explained to the (patient/caregiver).  Consent for procedure obtained. Time Out: Verified patient identification, verified procedure, site/side was marked, verified correct patient position, special equipment/implants available, medications/allergies/relevent history reviewed, required imaging and test results available.  Performed  Maximum sterile technique was used including antiseptics, cap, gloves, gown, hand hygiene, mask and sheet. Skin prep: Chlorhexidine; local anesthetic administered A antimicrobial bonded/coated triple lumen catheter was placed in the right femoral vein due to multiple attempts, no other available access using the Seldinger technique.  Evaluation Blood flow good Complications: No apparent complications Patient did tolerate procedure well. Chest X-ray ordered to verify placement.  CXR: pending.  Attempted to place left internal jugular central line utilizing ultrasound, however unable to advance guidewire after 2 attempts therefore right femoral central line placed utilizing ultrasound no complications noted during or following procedure cxr pending.  Marda Stalker, [place] Pager [phone] (please enter 7 digits) PCCM Consult Pager [phone] (please enter 7 digits)

## 2016-12-30 NOTE — Progress Notes (Signed)
Pharmacy Antibiotic Note  Xavier King is a 71 y.o. male admitted on 12/21/2016 with HCAP/sepsis. Patient has history significant for myelodysplastic syndrome. Pharmacy has been consulted for cefepime and vancomycin dosing. Patient received vancomycin and cefepime in ED.   Plan: Will continue cefepime 2g IV Q12hr.    Will continue vancomycin 1000mg  IV Q12hr for goal trough of 15-20. Will obtain trough prior to am dose on 2/18.   Height: 6\' 1"  (185.4 cm) Weight: 197 lb (89.4 kg) IBW/kg (Calculated) : 79.9  Temp (24hrs), Avg:100.3 F (37.9 C), Min:99.8 F (37.7 C), Max:103 F (39.4 C)   Recent Labs Lab 12/29/2016 1023 01/02/2017 1031  WBC  --  1.1*  CREATININE  --  1.49*  LATICACIDVEN 3.1*  --     Estimated Creatinine Clearance: 52.9 mL/min (by C-G formula based on SCr of 1.49 mg/dL (H)).    No Known Allergies  Antimicrobials this admission: Cefepime 2/16 >>  Vancomycin 2/16 >>   Dose adjustments this admission: N/A  Microbiology results: 2/16 BCx: pending  2/16 UCx: pending   2/16 MRSA PCR: pending   Pharmacy will continue to monitor and adjust per consult.   Simpson,Michael L 01/10/2017 3:21 PM

## 2016-12-30 NOTE — ED Notes (Signed)
MD Mable Paris and Kinner at bedside with RNs and RT for intubation

## 2016-12-30 NOTE — Consult Note (Signed)
Cardiology Consult    Patient ID: Xavier King MRN: [id number], DOB/AGE: 05-07-1946   Admit date: 12/24/2016 Date of Consult: 12/18/2016  Primary Physician: No PCP Per Patient Primary Cardiologist: New - seen by M. Fletcher Anon, MD  Requesting Provider: Lenna Sciara. Conforti, MD  Patient Profile    71 y/o ? with a reported h/o chronic afib on eliquis, HTN, DMII, cardiomyopathy (? ICM vs NICM), myelodysplastic syndrome, and melanoma, who presented to the [facility] ED on 2/16 with a 2 day h/o confusion with worsening mental status, fever, and afib RVR.  Past Medical History   Past Medical History:  Diagnosis Date  . Cardiomyopathy ([place])    a. 12/2016 Reported echo: EF 30-35%.  . Chronic a-fib ([place])    a. CHA2DS2VASc = 3-->Eliquis.  . Diabetes mellitus without complication ([place])   . Hypertension   . MDS (myelodysplastic syndrome) ([place])   . Melanoma ([place])     History reviewed. No pertinent surgical history.   Allergies  No Known Allergies  History of Present Illness    71 y/o ? with a reported h/o chronic afib and cardiomyopathy.  Other hx includes  HTN, DMII, myelodysplastic syndrome, and melanoma.  He is currently intubated and sedated and unable to participate in the interview.  Hx obtained from prior notes.  He lives in [place] and was recently hospitalized there but left AMA on 2/13 to travel to [place] to see his grandson play ball.  He has been dealing with anorexia and has not eaten since 2/11.  On 2/14, he was noted to be confusted.  This progressed and this AM, he was noted to be disoriented.  EMS was called to the hotel that he is staying @ and he was febrile w/ a temp of 101, tachycardic in 130's, and hypoxic.  He was taken to the ED where BNP was elevated @ 2471.  Lactate and PCT were elevated and thus a code Sepsis was initiated.  Broad spectrum abx have been ordered.  He did require intubation and has also been hypotensive and is currently requiring phenylephrine, norepinephrine, and  vasopressin infusions.  He is in rapid afib in the 130's to 140's and amio has been initiated.  It is believed that his afib is chronic.  We have been asked to eval r/t afib rvr.  Inpatient Medications    . ceFEPime (MAXIPIME) IV  2 g Intravenous Q12H  . DOPamine      . fentaNYL (SUBLIMAZE) injection  50 mcg Intravenous Once  . hydrocortisone sod succinate (SOLU-CORTEF) inj  100 mg Intravenous Q8H  . insulin aspart  0-9 Units Subcutaneous Q4H  . vancomycin  1,000 mg Intravenous Q12H    Family History    Pt is intubated and sedated and not currently able to supply this information.  Social History    Pt is intubated and sedated and not currently able to supply this information.  Social History   Social History  . Marital status: Married    Spouse name: N/A  . Number of children: N/A  . Years of education: N/A   Occupational History  . Not on file.   Social History Main Topics  . Smoking status: Current Every Day Smoker    Types: Cigars  . Smokeless tobacco: Never Used     Comment: 1 cigar a day  . Alcohol use No  . Drug use: Unknown  . Sexual activity: Not on file   Other Topics Concern  . Not on file   Social History  Narrative  . No narrative on file     Review of Systems    Pt is intubated and sedated and not currently able to supply this information.  Physical Exam    Blood pressure (!) 84/69, pulse (!) 120, temperature 100 F (37.8 C), resp. rate (!) 0, height 6\' 1"  (1.854 m), weight 197 lb (89.4 kg), SpO2 100 %.  General: Intubated/sedated. Psych: Intubated/sedated. Neuro: Intubated/sedated. HEENT: Jaundiced. No trauma.  Neck: Supple without bruits or JVD. Lungs:  Resp regular and unlabored, diminished breath sounds bilat. Heart: IR, IR, tachy, no s3, s4, or murmurs. Abdomen: Soft, non-tender, non-distended, BS + x 4.  Extremities: No clubbing, cyanosis.  Trace bilat LE edema. DP/PT/Radials 2+ and equal bilaterally.  Labs     Recent Labs   12/29/2016 1031  TROPONINI <0.03   Lab Results  Component Value Date   WBC 1.1 (LL) 01/04/2017   HGB 9.9 (L) 12/25/2016   HCT 28.2 (L) 12/26/2016   MCV 93.8 12/19/2016   PLT 91 (L) 12/27/2016    Recent Labs Lab 12/28/2016 1031  NA 132*  K 4.7  CL 99*  CO2 22  BUN 65*  CREATININE 1.49*  CALCIUM 8.6*  PROT 5.8*  BILITOT 18.9*  ALKPHOS 71  ALT 497*  AST 106*  GLUCOSE 251*    Radiology Studies    Dg Abdomen 1 View  Result Date: 12/28/2016 CLINICAL DATA:  Status post OG tube placement EXAM: ABDOMEN - 1 VIEW COMPARISON:  None. FINDINGS: Gastric catheter lies at the level of the gastroesophageal junction. Proximal side port is within the distal esophagus. This should be advanced as clinically indicated. Left renal calculus is noted. No obstructive changes are seen. IMPRESSION: Gastric catheter at the GE junction as described. Left renal calculus. Electronically Signed   By: Inez Catalina M.D.   On: 12/20/2016 11:41   Ct Head Wo Contrast  Result Date: 12/17/2016 CLINICAL DATA:  Episode of confusion since Wednesday, history myelodysplastic syndrome, atrial fibrillation, hypertension, diabetes mellitus, prior melanoma EXAM: CT HEAD WITHOUT CONTRAST TECHNIQUE: Contiguous axial images were obtained from the base of the skull through the vertex without intravenous contrast. Sagittal and coronal MPR images reconstructed from axial data set. COMPARISON:  None FINDINGS: Brain: Generalized atrophy. Normal ventricular morphology. No midline shift or mass effect. Minimal small vessel chronic ischemic changes of deep cerebral white matter. No intracranial hemorrhage, mass lesion, evidence of acute infarction, or extra-axial fluid collection. Vascular: Atherosclerotic calcifications at the carotid arteries and vertebral Skull: Demineralized but intact Sinuses/Orbits: Mucosal thickening in ethmoid air cells. Remaining paranasal sinuses and mastoid air cells clear. Other: N/A IMPRESSION: Atrophy with  minimal small vessel chronic ischemic changes of deep cerebral white matter. No acute intracranial abnormalities. Electronically Signed   By: Lavonia Dana M.D.   On: 12/17/2016 12:17   Dg Chest Port 1 View  Result Date: 12/19/2016 CLINICAL DATA:  Intubated.  Cough. EXAM: PORTABLE CHEST 1 VIEW COMPARISON:  12/21/2016 FINDINGS: There is an endotracheal tube with the tip 5.8 cm above the carina. There is no pleural effusion or pneumothorax. There is no focal consolidation. There is mild cardiomegaly. There is a nasogastric tube coursing below the diaphragm. The osseous structures are unremarkable. IMPRESSION: Endotracheal tube with the tip 5.8 cm above the carina. Electronically Signed   By: Kathreen Devoid   On: 12/20/2016 11:38   US Abdomen Limited Ruq  Result Date: 01/04/2017 CLINICAL DATA:  Elevated bilirubin. EXAM: US ABDOMEN LIMITED - RIGHT UPPER QUADRANT COMPARISON:  None. FINDINGS: Gallbladder: The stone near the gallbladder neck. Dependent gallbladder sludge. No wall thickening. Common bile duct: Diameter: 3.6 mm Liver: Increased parenchymal echogenicity. Normal liver size. No liver mass or focal lesion. Trace amount ascites noted adjacent to the liver underneath the right hemidiaphragm. Small right pleural effusion. IMPRESSION: 1. No intra or extrahepatic bile duct dilation. No evidence of biliary obstruction. 2. Small gallstone in gallbladder sludge, but no sonographic evidence of acute cholecystitis. 3. Increased parenchymal echogenicity of the liver consistent with hepatic steatosis. 4. Trace amount of ascites.  Small right pleural effusion. Electronically Signed   By: Lajean Manes M.D.   On: 01/10/2017 13:42    ECG & Cardiac Imaging    Afib, 140, rightward axis, poor R progression, ST dep V5-6.  No old for comparison.  Assessment & Plan    1. Sepsis/Septic Shock:  Pt presented 2/16 with a 2 day h/o progressively altered mentation after recent hospitalization in [place].  Found to be tachy,  febrile, hypoxic, and hypotensive.  Lactic acid 3.1. PCT 4.74.  Code sepsis mgmt, abx/pressors per CCM.  2.  Afib RVR:  Per notes, he has chronic afib and had recently been switched from coumadin to eliquis.  Rates elevated in the setting of above.  Cont IV amio for rate control.  Anticoagulation on hold in setting of anemia/thrombocytopenia.  Would add heparin once stable or eliquis once taking PO's.  BP too soft for  blocker/dilt.  Would avoid digoxin in setting of mild renal insuff and probable worsening renal failure in setting of hypotension.  3.  Cardiomyopathy:  Not clear at this point if this is ischemic or nonishcemic.  EF reportedly 30-35%.  BNP elevated.  Mildly volume overloaded on exam.  No diuresis in setting of #1.  Follow CVP via central line.  4.  Acute hypoxic resp failure:  In setting of above.  Vent mgmt per IM.  5.  AKI:  Creat 1.49 on admission.  ? Baseline.  Follow.  6.  Elevated LFT's:  GI c/s pending.  Abd u/s w/o bile duct dilatation or obstruction.  7.  Myelodysplastic syndrome:  Follow.  Signed, Murray Hodgkins, NP 12/20/2016, 4:32 PM

## 2016-12-31 ENCOUNTER — Inpatient Hospital Stay: Payer: Managed Care, Other (non HMO)

## 2016-12-31 ENCOUNTER — Inpatient Hospital Stay (HOSPITAL_COMMUNITY)
Admit: 2016-12-31 | Discharge: 2016-12-31 | Disposition: A | Discharge status: Home / Self Care | Payer: Managed Care, Other (non HMO) | Attending: Critical Care Medicine | Admitting: Critical Care Medicine

## 2016-12-31 DIAGNOSIS — R0603 Acute respiratory distress: Secondary | ICD-10-CM

## 2016-12-31 LAB — BLOOD GAS, ARTERIAL
Acid-base deficit: 4.4 mmol/L — ABNORMAL HIGH (ref 0.0–2.0)
Bicarbonate: 21 mmol/L (ref 20.0–28.0)
FIO2: 0.3
LHR: 20 {breaths}/min
MECHVT: 500 mL
O2 Saturation: 98.5 %
PEEP/CPAP: 5 cmH2O
PH ART: 7.34 — AB (ref 7.350–7.450)
Patient temperature: 37
pCO2 arterial: 39 mmHg (ref 32.0–48.0)
pO2, Arterial: 120 mmHg — ABNORMAL HIGH (ref 83.0–108.0)

## 2016-12-31 LAB — LACTIC ACID, PLASMA
LACTIC ACID, VENOUS: 2.3 mmol/L — AB (ref 0.5–1.9)
Lactic Acid, Venous: 2.2 mmol/L (ref 0.5–1.9)

## 2016-12-31 LAB — ALT: ALT: 292 U/L — AB (ref 17–63)

## 2016-12-31 LAB — BLOOD CULTURE ID PANEL (REFLEXED)
Acinetobacter baumannii: NOT DETECTED
CANDIDA KRUSEI: NOT DETECTED
CARBAPENEM RESISTANCE: NOT DETECTED
Candida albicans: NOT DETECTED
Candida glabrata: NOT DETECTED
Candida parapsilosis: NOT DETECTED
Candida tropicalis: NOT DETECTED
ENTEROCOCCUS SPECIES: NOT DETECTED
Enterobacter cloacae complex: NOT DETECTED
Enterobacteriaceae species: DETECTED — AB
Escherichia coli: DETECTED — AB
HAEMOPHILUS INFLUENZAE: NOT DETECTED
Klebsiella oxytoca: NOT DETECTED
Klebsiella pneumoniae: NOT DETECTED
Listeria monocytogenes: NOT DETECTED
NEISSERIA MENINGITIDIS: NOT DETECTED
Proteus species: NOT DETECTED
Pseudomonas aeruginosa: NOT DETECTED
SERRATIA MARCESCENS: NOT DETECTED
STAPHYLOCOCCUS AUREUS BCID: NOT DETECTED
STAPHYLOCOCCUS SPECIES: NOT DETECTED
STREPTOCOCCUS AGALACTIAE: NOT DETECTED
STREPTOCOCCUS SPECIES: NOT DETECTED
Streptococcus pneumoniae: NOT DETECTED
Streptococcus pyogenes: NOT DETECTED

## 2016-12-31 LAB — BASIC METABOLIC PANEL
ANION GAP: 9 (ref 5–15)
BUN: 72 mg/dL — ABNORMAL HIGH (ref 6–20)
CALCIUM: 7.6 mg/dL — AB (ref 8.9–10.3)
CO2: 22 mmol/L (ref 22–32)
CREATININE: 2.23 mg/dL — AB (ref 0.61–1.24)
Chloride: 102 mmol/L (ref 101–111)
GFR, EST AFRICAN AMERICAN: 33 mL/min — AB (ref >60)
GFR, EST NON AFRICAN AMERICAN: 28 mL/min — AB (ref >60)
GLUCOSE: 317 mg/dL — AB (ref 65–99)
Potassium: 4.5 mmol/L (ref 3.5–5.1)
Sodium: 133 mmol/L — ABNORMAL LOW (ref 135–145)

## 2016-12-31 LAB — GLUCOSE, CAPILLARY
GLUCOSE-CAPILLARY: 255 mg/dL — AB (ref 65–99)
GLUCOSE-CAPILLARY: 270 mg/dL — AB (ref 65–99)
GLUCOSE-CAPILLARY: 279 mg/dL — AB (ref 65–99)
GLUCOSE-CAPILLARY: 280 mg/dL — AB (ref 65–99)
GLUCOSE-CAPILLARY: 291 mg/dL — AB (ref 65–99)
Glucose-Capillary: 290 mg/dL — ABNORMAL HIGH (ref 65–99)

## 2016-12-31 LAB — PHOSPHORUS: Phosphorus: UNDETERMINED mg/dL (ref 2.5–4.6)

## 2016-12-31 LAB — ECHOCARDIOGRAM COMPLETE
Height: 73 in
Weight: 3291.03 oz

## 2016-12-31 LAB — BILIRUBIN, TOTAL: BILIRUBIN TOTAL: 17 mg/dL — AB (ref 0.3–1.2)

## 2016-12-31 LAB — TROPONIN I: Troponin I: <0.03 ng/mL (ref <0.03)

## 2016-12-31 LAB — AST: AST: 51 U/L — AB (ref 15–41)

## 2016-12-31 LAB — DAT, POLYSPECIFIC AHG (ARMC ONLY): Polyspecific AHG test: NEGATIVE

## 2016-12-31 LAB — CBC
HEMATOCRIT: 26.5 % — AB (ref 40.0–52.0)
Hemoglobin: 8.8 g/dL — ABNORMAL LOW (ref 13.0–18.0)
MCH: 32 pg (ref 26.0–34.0)
MCHC: 33.3 g/dL (ref 32.0–36.0)
MCV: 95.9 fL (ref 80.0–100.0)
PLATELETS: 72 10*3/uL — AB (ref 150–440)
RBC: 2.76 MIL/uL — ABNORMAL LOW (ref 4.40–5.90)
RDW: 22.5 % — AB (ref 11.5–14.5)
WBC: 2.4 10*3/uL — AB (ref 3.8–10.6)

## 2016-12-31 LAB — BILIRUBIN, DIRECT: Bilirubin, Direct: 9.6 mg/dL — ABNORMAL HIGH (ref 0.1–0.5)

## 2016-12-31 LAB — LIPASE, BLOOD: Lipase: 19 U/L (ref 11–51)

## 2016-12-31 LAB — PROCALCITONIN: PROCALCITONIN: 12.56 ng/mL

## 2016-12-31 LAB — LACTATE DEHYDROGENASE: LDH: 250 U/L — AB (ref 98–192)

## 2016-12-31 LAB — AMYLASE: Amylase: 7 U/L — ABNORMAL LOW (ref 28–100)

## 2016-12-31 LAB — MAGNESIUM: Magnesium: 1.9 mg/dL (ref 1.7–2.4)

## 2016-12-31 LAB — MRSA PCR SCREENING: MRSA by PCR: POSITIVE — AB

## 2016-12-31 MED ORDER — SODIUM CHLORIDE 0.9 % IV BOLUS (SEPSIS)
500.0000 mL | Freq: Once | INTRAVENOUS | Status: AC
  Administered 2016-12-31: 500 mL via INTRAVENOUS

## 2016-12-31 MED ORDER — MEROPENEM-SODIUM CHLORIDE 1 GM/50ML IV SOLR
1.0000 g | Freq: Two times a day (BID) | INTRAVENOUS | Status: DC
  Administered 2016-12-31 – 2017-01-02 (×4): 1 g via INTRAVENOUS
  Filled 2016-12-31 (×6): qty 50

## 2016-12-31 MED ORDER — SODIUM CHLORIDE 0.9 % IV SOLN
1.0000 g | Freq: Three times a day (TID) | INTRAVENOUS | Status: DC
  Filled 2016-12-31 (×2): qty 1

## 2016-12-31 MED ORDER — MEROPENEM-SODIUM CHLORIDE 1 GM/50ML IV SOLR
1.0000 g | Freq: Three times a day (TID) | INTRAVENOUS | Status: DC
  Administered 2016-12-31: 1 g via INTRAVENOUS
  Filled 2016-12-31 (×4): qty 50

## 2016-12-31 MED ORDER — VANCOMYCIN HCL 10 G IV SOLR
1250.0000 mg | INTRAVENOUS | Status: DC
  Administered 2017-01-01 – 2017-01-02 (×2): 1250 mg via INTRAVENOUS
  Filled 2016-12-31 (×3): qty 1250

## 2016-12-31 MED ORDER — CHLORHEXIDINE GLUCONATE CLOTH 2 % EX PADS
6.0000 | MEDICATED_PAD | Freq: Every day | CUTANEOUS | Status: AC
  Administered 2016-12-31 – 2017-01-04 (×5): 6 via TOPICAL

## 2016-12-31 MED ORDER — MUPIROCIN 2 % EX OINT
1.0000 "application " | TOPICAL_OINTMENT | Freq: Two times a day (BID) | CUTANEOUS | Status: AC
  Administered 2016-12-31 – 2017-01-04 (×10): 1 via NASAL
  Filled 2016-12-31: qty 22

## 2016-12-31 MED ORDER — SODIUM CHLORIDE 0.9% FLUSH
10.0000 mL | Freq: Two times a day (BID) | INTRAVENOUS | Status: DC
  Administered 2016-12-31 – 2017-01-02 (×5): 10 mL
  Administered 2017-01-02: 40 mL
  Administered 2017-01-03: 20 mL
  Administered 2017-01-03 – 2017-01-04 (×2): 10 mL

## 2016-12-31 NOTE — Progress Notes (Signed)
PHARMACY - PHYSICIAN COMMUNICATION CRITICAL VALUE ALERT - BLOOD CULTURE IDENTIFICATION (BCID)  Results for orders placed or performed during the hospital encounter of 01/08/2017  Blood Culture ID Panel (Reflexed) (Collected: 01/01/2017 10:32 AM)  Result Value Ref Range   Enterococcus species NOT DETECTED NOT DETECTED   Listeria monocytogenes NOT DETECTED NOT DETECTED   Staphylococcus species NOT DETECTED NOT DETECTED   Staphylococcus aureus NOT DETECTED NOT DETECTED   Streptococcus species NOT DETECTED NOT DETECTED   Streptococcus agalactiae NOT DETECTED NOT DETECTED   Streptococcus pneumoniae NOT DETECTED NOT DETECTED   Streptococcus pyogenes NOT DETECTED NOT DETECTED   Acinetobacter baumannii NOT DETECTED NOT DETECTED   Enterobacteriaceae species DETECTED (A) NOT DETECTED   Enterobacter cloacae complex NOT DETECTED NOT DETECTED   Escherichia coli DETECTED (A) NOT DETECTED   Klebsiella oxytoca NOT DETECTED NOT DETECTED   Klebsiella pneumoniae NOT DETECTED NOT DETECTED   Proteus species NOT DETECTED NOT DETECTED   Serratia marcescens NOT DETECTED NOT DETECTED   Carbapenem resistance NOT DETECTED NOT DETECTED   Haemophilus influenzae NOT DETECTED NOT DETECTED   Neisseria meningitidis NOT DETECTED NOT DETECTED   Pseudomonas aeruginosa NOT DETECTED NOT DETECTED   Candida albicans NOT DETECTED NOT DETECTED   Candida glabrata NOT DETECTED NOT DETECTED   Candida krusei NOT DETECTED NOT DETECTED   Candida parapsilosis NOT DETECTED NOT DETECTED   Candida tropicalis NOT DETECTED NOT DETECTED    Name of physician (or Provider) Contacted: Patria Mane NP  Changes to prescribed antibiotics required: Change from cefepime to meropenem PTD  Laural Benes, Pharm.D., BCPS Clinical Pharmacist 12/31/2016  12:46 AM

## 2016-12-31 NOTE — Progress Notes (Signed)
Pharmacy Antibiotic Note  Xavier King is a 71 y.o. male admitted on 12/24/2016 with sepsis.  Pharmacy has been consulted for meropenem dosing.  Plan: Lab reports E. Coli in 4 of 4 bottles. Spoke with M. Patria Mane NP - discontinue cefepime and start meropenem PTD. Meropenem 1 gm IV Q8H  Height: 6\' 1"  (185.4 cm) Weight: 197 lb (89.4 kg) IBW/kg (Calculated) : 79.9  Temp (24hrs), Avg:100.2 F (37.9 C), Min:99 F (37.2 C), Max:103 F (39.4 C)   Recent Labs Lab 12/29/2016 1023 01/03/2017 1031 12/16/2016 1323  WBC  --  1.1*  --   CREATININE  --  1.49*  --   LATICACIDVEN 3.1*  --  2.5*    Estimated Creatinine Clearance: 52.9 mL/min (by C-G formula based on SCr of 1.49 mg/dL (H)).    No Known Allergies  Thank you for allowing pharmacy to be a part of this patient's care.  Laural Benes, Pharm.D., BCPS Clinical Pharmacist 12/31/2016 12:46 AM

## 2016-12-31 NOTE — Consult Note (Signed)
Consultation  Referring Provider:      Primary Care Physician:  No PCP Per Patient Primary Gastroenterologist:  San Jetty MD       Reason for Consultation:    Hyperbilirubinemia      Impression / Plan:   Hyperbilirubinemia: Mixed: Nonobstructive based on normal CBD seen on ultrasound. Most likely due to sepsis and hyperfusion. May also have a degree of hemolysis given severity of sepsis. No h/x of known cirrhosis  No intervention at this time except for continued supportive care and antibiotics to treat bacteria sepsis.   Obtain hemolysis workup. DAT, haptoglobin, LDH, reticulocyte count          HPI:   Xavier King is a 71 y.o. male with a PMHX of Atrial Fibrillation, Diabetes Mellitus, HTN, Myelodysplastic Syndrome, recent Echo 12/2016 EF 30% to 35% admitted for bacterial sepsis, renal failure and respiratory failure noted on admission to have total bilirubin > 18.0, direct bilirubin 9.6. On vasopressors to maintain blood pressure.  US showed non-dilated CBD(3.48m) with single gallstone and sludge. No ultrasound evidence of acute cholecystitis. Hepatic steatosis.   Past Medical History:  Diagnosis Date  . [place]omyopathy ([place]    a. 12/2016 Reported echo: EF 30-35%.  . Chronic [place]fib ([place]    a. CHA2DS2VASc = 3-->Eliquis.  . Diabetes mellitus without complication [place]   . Hypertension   . M[place] syndrome) ([place]   . Melanoma ([place]     History reviewed. No pertinent surgical history.  History reviewed. No pertinent family history.    Social History  Substance Use Topics  . Smoking status: Current Every Day Smoker    Types: Cigars  . Smokeless tobacco: Never Used     Comment: 1 cigar a day  . Alcohol use No    Prior to Admission medications   Medication Sig Start Date End Date Taking? Authorizing Provider  acetaminophen (TYLENOL) 325 MG tablet Take 650 mg by mouth every 6 (six) hours as needed.   Yes Historical Provider, MD  alum & mag  hydroxide-simeth (MAALOX/MYLANTA) 200-200-20 MG/5ML suspension Take 30 mLs by mouth every 6 (six) hours as needed for indigestion or heartburn.   Yes Historical Provider, MD  apixaban (ELIQUIS) 5 MG TABS tablet Take 5 mg by mouth 2 (two) times daily.   Yes Historical Provider, MD  aspirin EC 81 MG tablet Take 81 mg by mouth daily.   Yes Historical Provider, MD  atorvastatin (LIPITOR) 10 MG tablet Take 10 mg by mouth daily.   Yes Historical Provider, MD  calcium carbonate (TUMS - DOSED IN MG ELEMENTAL CALCIUM) 500 MG chewable tablet Chew 1 tablet by mouth 4 (four) times daily as needed for indigestion or heartburn.   Yes Historical Provider, MD  cyanocobalamin 1000 MCG tablet Take 1,000 mcg by mouth daily.   Yes Historical Provider, MD  diltiazem (DILTIAZEM CD) 120 MG 24 hr capsule Take 120 mg by mouth daily.   Yes Historical Provider, MD  glyBURIDE (DIABETA) 5 MG tablet Take 10 mg by mouth 2 (two) times daily with a meal.   Yes Historical Provider, MD  guaiFENesin-codeine (VIRTUSSIN A/C) 100-10 MG/5ML syrup Take 10 mLs by mouth 3 (three) times daily as needed for cough.   Yes Historical Provider, MD  insulin aspart (NOVOLOG) 100 UNIT/ML injection Inject into the skin 3 (three) times daily before meals. Sliding scale   Yes Historical Provider, MD  Melatonin 3 MG TABS Take 6 mg by mouth at bedtime as needed.   Yes Historical Provider,  MD  metoprolol succinate (TOPROL-XL) 100 MG 24 hr tablet Take 100 mg by mouth daily. Take with or immediately following a meal.   Yes Historical Provider, MD  senna-docusate (SENOKOT-S) 8.6-50 MG tablet Take 1 tablet by mouth 2 (two) times daily as needed for mild constipation.   Yes Historical Provider, MD    Current Facility-Administered Medications  Medication Dose Route Frequency Provider Last Rate Last Dose  . 0.9 %  sodium chloride infusion  250 mL Intravenous PRN Awilda Bill, NP 10 mL/hr at 12/31/16 1200 250 mL at 12/31/16 1200  . amiodarone (NEXTERONE  PREMIX) 360-4.14 MG/200ML-% (1.8 mg/mL) IV infusion  30 mg/hr Intravenous Continuous Awilda Bill, NP 16.7 mL/hr at 12/31/16 1230 30 mg/hr at 12/31/16 1230  . chlorhexidine gluconate (MEDLINE KIT) (PERIDEX) 0.12 % solution 15 mL  15 mL Mouth Rinse BID John Conforti, DO   15 mL at 12/31/16 0845  . Chlorhexidine Gluconate Cloth 2 % PADS 6 each  6 each Topical Q0600 John Conforti, DO   6 each at 12/31/16 0730  . fentaNYL (SUBLIMAZE) bolus via infusion 25 mcg  25 mcg Intravenous Q1H PRN Awilda Bill, NP      . fentaNYL (SUBLIMAZE) injection 50 mcg  50 mcg Intravenous Q15 min PRN Awilda Bill, NP   50 mcg at 12/31/16 0219  . fentaNYL (SUBLIMAZE) injection 50 mcg  50 mcg Intravenous Q2H PRN Awilda Bill, NP      . fentaNYL (SUBLIMAZE) injection 50 mcg  50 mcg Intravenous Once Awilda Bill, NP      . fentaNYL 2585mg in NS 2556m(107mml) infusion-PREMIX  25-400 mcg/hr Intravenous Continuous NeiDarel HongD 10 mL/hr at 12/31/16 1200 100 mcg/hr at 12/31/16 1200  . hydrocortisone sodium succinate (SOLU-CORTEF) 100 MG injection 100 mg  100 mg Intravenous Q8H DanAwilda BillP   100 mg at 12/31/16 0628338 insulin aspart (novoLOG) injection 0-9 Units  0-9 Units Subcutaneous Q4H DanAwilda BillP   5 Units at 12/31/16 1229  . MEDLINE mouth rinse  15 mL Mouth Rinse QID John Conforti, DO   15 mL at 12/31/16 1229  . meropenem (MERREM) IVPB SOLR 1 g  1 g Intravenous Q12H Sheema M Hallaji, RPH      . mupirocin ointment (BACTROBAN) 2 % 1 application  1 application Nasal BID JohHermelinda DellenO   1 application at 01/09/04/3909  . norepinephrine (LEVOPHED) 16 mg in dextrose 5 % 250 mL (0.064 mg/mL) infusion  0-40 mcg/min Intravenous Continuous MagMikael SprayP 4.7 mL/hr at 12/31/16 1200 5.013 mcg/min at 12/31/16 1200  . phenylephrine (NEO-SYNEPHRINE) 40 mg in sodium chloride 0.9 % 250 mL (0.16 mg/mL) infusion  0-400 mcg/min Intravenous Titrated MagMikael SprayP 24.4 mL/hr at 12/31/16 1200  65.067 mcg/min at 12/31/16 1200  . propofol (DIPRIVAN) 1000 MG/100ML infusion  5-80 mcg/kg/min Intravenous Continuous MagMikael SprayP 5.4 mL/hr at 12/31/16 1341 10 mcg/kg/min at 12/31/16 1341  . sodium chloride flush (NS) 0.9 % injection 10-40 mL  10-40 mL Intracatheter Q12H MagMikael SprayP   10 mL at 12/31/16 0908  . [START ON 01/01/2017] vancomycin (VANCOCIN) 1,250 mg in sodium chloride 0.9 % 250 mL IVPB  1,250 mg Intravenous Q24H Sheema M Hallaji, RPH      . vasopressin (PITRESSIN) 40 Units in sodium chloride 0.9 % 250 mL (0.16 Units/mL) infusion  0.03 Units/min Intravenous Continuous DanAwilda BillP 11.3 mL/hr at 12/31/16 1200 0.03 Units/min  at 12/31/16 1200    Allergies as of 01/07/2017  . (No Known Allergies)     Review of Systems:    Unable to obtain given unconscious      Physical Exam:  Vital signs in last 24 hours: Temp:  [97.5 F (36.4 C)-100.1 F (37.8 C)] 97.5 F (36.4 C) (02/17 1200) Pulse Rate:  [26-143] 109 (02/17 1200) Resp:  [10-23] 20 (02/17 1200) BP: (56-121)/(30-107) 94/62 (02/17 1200) SpO2:  [93 %-100 %] 98 % (02/17 1200) FiO2 (%):  [30 %] 30 % (02/17 1200) Weight:  [93.3 kg (205 lb 11 oz)] 93.3 kg (205 lb 11 oz) (02/17 0500) Last BM Date:  (unknown)  General:  Well-developed, well-nourished and in no acute distress Eyes:  anicteric. ENT:   Mouth and posterior pharynx free of lesions.  Neck:   supple w/o thyromegaly or mass.  Lungs: Clear to auscultation bilaterally. Heart:  S1S2, no rubs, murmurs, gallops. Abdomen:  soft, non-tender, no hepatosplenomegaly, hernia, or mass and BS+.  Rectal: Lymph:  no cervical or supraclavicular adenopathy. Extremities:   no edema Skin   no rash. Neuro:  A&O x 3.  Psych:  appropriate mood and  Affect.   Data Reviewed:   LAB RESULTS:  Recent Labs  12/22/2016 1031 12/31/16 0402  WBC 1.1* 2.4*  HGB 9.9* 8.8*  HCT 28.2* 26.5*  PLT 91* 72*   BMET  Recent Labs  01/01/2017 1031  12/31/16 0402  NA 132* 133*  K 4.7 4.5  CL 99* 102  CO2 22 22  GLUCOSE 251* 317*  BUN 65* 72*  CREATININE 1.49* 2.23*  CALCIUM 8.6* 7.6*   LFT  Recent Labs  12/18/2016 1031  12/31/16 0402  PROT 5.8*  --   --   ALBUMIN 2.5*  --   --   AST 106*  --  51*  ALT 497*  --  292*  ALKPHOS 71  --   --   BILITOT 18.9*  --  17.0*  BILIDIR  --   < > 9.6*  < > = values in this interval not displayed. PT/INR  Recent Labs  01/11/2017 1031  LABPROT 26.6*  INR 2.40    STUDIES: Dg Abdomen 1 View  Result Date: 01/01/2017 CLINICAL DATA:  Status post OG tube placement EXAM: ABDOMEN - 1 VIEW COMPARISON:  None. FINDINGS: Gastric catheter lies at the level of the gastroesophageal junction. Proximal side port is within the distal esophagus. This should be advanced as clinically indicated. Left renal calculus is noted. No obstructive changes are seen. IMPRESSION: Gastric catheter at the GE junction as described. Left renal calculus. Electronically Signed   By: Inez Catalina M.D.   On: 12/19/2016 11:41   Ct Head Wo Contrast  Result Date: 12/15/2016 CLINICAL DATA:  Episode of confusion since Wednesday, history myelodysplastic syndrome, atrial fibrillation, hypertension, diabetes mellitus, prior melanoma EXAM: CT HEAD WITHOUT CONTRAST TECHNIQUE: Contiguous axial images were obtained from the base of the skull through the vertex without intravenous contrast. Sagittal and coronal MPR images reconstructed from axial data set. COMPARISON:  None FINDINGS: Brain: Generalized atrophy. Normal ventricular morphology. No midline shift or mass effect. Minimal small vessel chronic ischemic changes of deep cerebral white matter. No intracranial hemorrhage, mass lesion, evidence of acute infarction, or extra-axial fluid collection. Vascular: Atherosclerotic calcifications at the carotid arteries and vertebral Skull: Demineralized but intact Sinuses/Orbits: Mucosal thickening in ethmoid air cells. Remaining paranasal sinuses  and mastoid air cells clear. Other: N/A IMPRESSION: Atrophy with minimal small vessel  chronic ischemic changes of deep cerebral white matter. No acute intracranial abnormalities. Electronically Signed   By: Lavonia Dana M.D.   On: 12/22/2016 12:17   Dg Chest Port 1 View  Result Date: 12/31/2016 CLINICAL DATA:  Initial evaluation for acute respiratory failure. EXAM: PORTABLE CHEST 1 VIEW COMPARISON:  Prior radiograph from 01/04/2017. FINDINGS: Patient remains intubated with the tip of the endotracheal tube located at the upper margin of the clavicles. Enteric tube overlies the stomach. Stable cardiomegaly. Mediastinal silhouette within normal limits. Aortic atherosclerosis. Persistent hazy and patchy bibasilar opacities, worsened on the left, which may reflect atelectasis and/ or infiltrates. Perihilar vascular congestion without overt pulmonary edema. Suspected small bilateral pleural effusions. No pneumothorax. No acute osseous abnormality. IMPRESSION: 1. Support apparatus in satisfactory position. 2. Persistent hazy and patchy bibasilar opacities, worsened on the left. Findings may reflect atelectasis and/or infiltrates. 3. Suspected small bilateral pleural effusions. Electronically Signed   By: Jeannine Boga M.D.   On: 12/31/2016 03:57   Dg Chest Port 1 View  Result Date: 01/08/2017 CLINICAL DATA:  Right side groin central line placement EXAM: PORTABLE CHEST 1 VIEW COMPARISON:  01/02/2017 FINDINGS: Cardiomegaly is noted. Stable endotracheal and NG tube position. There is hazy atelectasis or infiltrate in right base. Aspiration cannot be excluded. No central line is identified. There is no pneumothorax. IMPRESSION: Endotracheal tube and NG tube in place. Hazy atelectasis or infiltrate in right lower lobe. Aspiration cannot be excluded. No pneumothorax. No central line is identified. Electronically Signed   By: Lahoma Crocker M.D.   On: 12/27/2016 16:52   Dg Chest Port 1 View  Result Date:  12/26/2016 CLINICAL DATA:  Intubated.  Cough. EXAM: PORTABLE CHEST 1 VIEW COMPARISON:  12/15/2016 FINDINGS: There is an endotracheal tube with the tip 5.8 cm above the carina. There is no pleural effusion or pneumothorax. There is no focal consolidation. There is mild cardiomegaly. There is a nasogastric tube coursing below the diaphragm. The osseous structures are unremarkable. IMPRESSION: Endotracheal tube with the tip 5.8 cm above the carina. Electronically Signed   By: Kathreen Devoid   On: 01/06/2017 11:38   US Abdomen Limited Ruq  Result Date: 12/20/2016 CLINICAL DATA:  Elevated bilirubin. EXAM: US ABDOMEN LIMITED - RIGHT UPPER QUADRANT COMPARISON:  None. FINDINGS: Gallbladder: The stone near the gallbladder neck. Dependent gallbladder sludge. No wall thickening. Common bile duct: Diameter: 3.6 mm Liver: Increased parenchymal echogenicity. Normal liver size. No liver mass or focal lesion. Trace amount ascites noted adjacent to the liver underneath the right hemidiaphragm. Small right pleural effusion. IMPRESSION: 1. No intra or extrahepatic bile duct dilation. No evidence of biliary obstruction. 2. Small gallstone in gallbladder sludge, but no sonographic evidence of acute cholecystitis. 3. Increased parenchymal echogenicity of the liver consistent with hepatic steatosis. 4. Trace amount of ascites.  Small right pleural effusion. Electronically Signed   By: Lajean Manes M.D.   On: 12/25/2016 13:42     PREVIOUS ENDOSCOPIES:                Thanks   LOS: 1 day  San Jetty MD @  12/31/2016, 1:57 PM

## 2016-12-31 NOTE — Progress Notes (Signed)
Patient remains on pressors and sedation, attempted to wean sedation slightly when patient became less responsive in afternoon.  Afterward patient was awake with eyes open, nodded and shook his head appropriately, but appeared to become panicky at times, reached for ETT, and seemed frightened.  A Fib HR accelerated into 130s.  Increased sedation and he became more restful.  HR slowly decelerating to baseline.  SBP decreased into the 60s with increased sedation; titrated pressors up.  UOP 135 this shift.  Very thick, orange/brown in color.  80 ccs Xavier King OG output.  Family very involved and cooperative, supportive of patient.  Patient is jaundiced appearing.  Vent settings not changed this shift.  Patient is still hypothermic at 36.5 degrees celsius.

## 2016-12-31 NOTE — Progress Notes (Addendum)
[facility] Progess Note    ASSESSMENT/PLAN   Septic shock: Blood cultures positive for gram-negative rods, Enterobacter presently on meropenem and vancomycin. Has received fluid boluses, empiric hydrocortisone. Presently on vasopressin, norepinephrine and Neo-Synephrine. Weaning as tolerated first weaning the norepinephrine secondary to tachycardia rapid atrial fibrillation  Respiratory failure: Presently on PRBC, 30% FiO2 with a target title volume 500. Stable arterial blood gas  Atrial fibrillation: Rapid atrial fibrillation with a ventricular response approximately 100-110. Presently on amiodarone infusion, appreciate cardiology's assistance and input, will continue amiodarone  Cardiomyopathy: History of cardiomyopathy with ejection fraction of 30-35%  Pancytopenia: Underlying history of mild dysplastic syndrome. We'll hold transfusion of blood until his hemoglobin gets less than 7 and platelets less than 20 or any evidence of active bleeding.  Obstructive jaundice: Total bilirubin has decreased to 17, no evidence of obstruction on the right upper quadrant ultrasound, fatty liver noted.  Renal failure: Worsening renal function with a BUN of 72 and creatinine of 2.23. Most likely sepsis, ATN. At this time patient does not need hemodialysis. Will obtain renal ultrasound.   Hyperglycemia: On glycemic control  Myelodysplastic syndrome: History of mild dysplastic syndrome requiring frequent transfusion   Name: Xavier King MRN: [id number] DOB: [birth year]    ADMISSION DATE:  12/26/2016  SUBJECTIVE:   Pt currently on the ventilator, can not provide history or review of systems. Over the last 24 hours his overall status has improved. Patient is on mechanical ventilation, central line in place, is on multiple pressors are being weaned actively. Blood cultures reveal gram negative rods Enterobacter species, antibiotics have been adjusted  VITAL SIGNS: Temp:   [98.1 F (36.7 C)-103 F (39.4 C)] 98.1 F (36.7 C) (02/17 0630) Pulse Rate:  [26-147] 112 (02/17 0630) Resp:  [0-39] 20 (02/17 0630) BP: (54-124)/(30-107) 94/75 (02/17 0630) SpO2:  [93 %-100 %] 98 % (02/17 0630) FiO2 (%):  [30 %-70 %] 30 % (02/17 0427) Weight:  [89.4 kg (197 lb)-93.3 kg (205 lb 11 oz)] 93.3 kg (205 lb 11 oz) (02/17 0500) HEMODYNAMICS: CVP:  [10 mmHg-17 mmHg] 17 mmHg VENTILATOR SETTINGS: Vent Mode: PRVC FiO2 (%):  [30 %-70 %] 30 % Set Rate:  [20 bmp] 20 bmp Vt Set:  [500 mL] 500 mL PEEP:  [5 cmH20] 5 cmH20 INTAKE / OUTPUT:  Intake/Output Summary (Last 24 hours) at 12/31/16 0808 Last data filed at 12/31/16 0600  Gross per 24 hour  Intake          6594.58 ml  Output              455 ml  Net          6139.58 ml    PHYSICAL EXAMINATION: Physical Examination:   VS: BP 94/75   Pulse (!) 112   Temp 98.1 F (36.7 C)   Resp 20   Ht 6\' 1"  (1.854 m)   Wt 93.3 kg (205 lb 11 oz)   SpO2 98%   BMI 27.14 kg/m   General Appearance: No distress. Presently on fentanyl and propofol for sedation Neuro: Limited exam secondary to sedative agents HEENT: his pupils appear equal and reactive today. Secondary to unstable status was unable to have CT scan of the head last night Pulmonary: Coarse rhonchi in pleural rub appreciated bilaterally. CardiovascularNormal S1,S2.  No m/r/g.   Abdomen: Benign, Soft, non-tender. Endocrine: No evident thyromegaly. Skin:   warm, no rashes, no ecchymosis  Extremities: normal, no cyanosis, clubbing.   LABS: Chest x-ray reveals prominent cardiac  silhouette with right infrahilar parenchymal infiltrates. The endotracheal tube appears slightly high will advance 1-2 cm  LABORATORY PANEL:   CBC  Recent Labs Lab 12/31/16 0402  WBC 2.4*  HGB 8.8*  HCT 26.5*  PLT 72*    Chemistries   Recent Labs Lab 12/25/2016 1031 12/31/16 0402  NA 132* 133*  K 4.7 4.5  CL 99* 102  CO2 22 22  GLUCOSE 251* 317*  BUN 65* 72*  CREATININE 1.49*  2.23*  CALCIUM 8.6* 7.6*  MG  --  1.9  PHOS  --  UNABLE TO REPORT DUE TO ICTERUS  AST 106* 51*  ALT 497* 292*  ALKPHOS 71  --   BILITOT 18.9* 17.0*     Recent Labs Lab 01/09/2017 1618 12/17/2016 2024 12/31/16 0005 12/31/16 0411 12/31/16 0728  GLUCAP 269* 321* 291* 290* 280*    Recent Labs Lab 01/04/2017 1208 01/02/2017 1830 12/31/16 0500  PHART 7.43 7.32* 7.34*  PCO2ART 31* 35 39  PO2ART 174* 124* 120*    Recent Labs Lab 12/29/2016 1031 12/31/16 0402  AST 106* 51*  ALT 497* 292*  ALKPHOS 71  --   BILITOT 18.9* 17.0*  ALBUMIN 2.5*  --     Cardiac Enzymes  Recent Labs Lab 12/31/16 0007  TROPONINI <0.03    RADIOLOGY:  Dg Abdomen 1 View  Result Date: 12/20/2016 CLINICAL DATA:  Status post OG tube placement EXAM: ABDOMEN - 1 VIEW COMPARISON:  None. FINDINGS: Gastric catheter lies at the level of the gastroesophageal junction. Proximal side port is within the distal esophagus. This should be advanced as clinically indicated. Left renal calculus is noted. No obstructive changes are seen. IMPRESSION: Gastric catheter at the GE junction as described. Left renal calculus. Electronically Signed   By: Inez Catalina M.D.   On: 01/04/2017 11:41   Ct Head Wo Contrast  Result Date: 01/04/2017 CLINICAL DATA:  Episode of confusion since Wednesday, history myelodysplastic syndrome, atrial fibrillation, hypertension, diabetes mellitus, prior melanoma EXAM: CT HEAD WITHOUT CONTRAST TECHNIQUE: Contiguous axial images were obtained from the base of the skull through the vertex without intravenous contrast. Sagittal and coronal MPR images reconstructed from axial data set. COMPARISON:  None FINDINGS: Brain: Generalized atrophy. Normal ventricular morphology. No midline shift or mass effect. Minimal small vessel chronic ischemic changes of deep cerebral white matter. No intracranial hemorrhage, mass lesion, evidence of acute infarction, or extra-axial fluid collection. Vascular: Atherosclerotic  calcifications at the carotid arteries and vertebral Skull: Demineralized but intact Sinuses/Orbits: Mucosal thickening in ethmoid air cells. Remaining paranasal sinuses and mastoid air cells clear. Other: N/A IMPRESSION: Atrophy with minimal small vessel chronic ischemic changes of deep cerebral white matter. No acute intracranial abnormalities. Electronically Signed   By: Lavonia Dana M.D.   On: 12/28/2016 12:17   Dg Chest Port 1 View  Result Date: 12/31/2016 CLINICAL DATA:  Initial evaluation for acute respiratory failure. EXAM: PORTABLE CHEST 1 VIEW COMPARISON:  Prior radiograph from 12/23/2016. FINDINGS: Patient remains intubated with the tip of the endotracheal tube located at the upper margin of the clavicles. Enteric tube overlies the stomach. Stable cardiomegaly. Mediastinal silhouette within normal limits. Aortic atherosclerosis. Persistent hazy and patchy bibasilar opacities, worsened on the left, which may reflect atelectasis and/ or infiltrates. Perihilar vascular congestion without overt pulmonary edema. Suspected small bilateral pleural effusions. No pneumothorax. No acute osseous abnormality. IMPRESSION: 1. Support apparatus in satisfactory position. 2. Persistent hazy and patchy bibasilar opacities, worsened on the left. Findings may reflect atelectasis and/or infiltrates. 3. Suspected  small bilateral pleural effusions. Electronically Signed   By: Jeannine Boga M.D.   On: 12/31/2016 03:57   Dg Chest Port 1 View  Result Date: 01/08/2017 CLINICAL DATA:  Right side groin central line placement EXAM: PORTABLE CHEST 1 VIEW COMPARISON:  01/06/2017 FINDINGS: Cardiomegaly is noted. Stable endotracheal and NG tube position. There is hazy atelectasis or infiltrate in right base. Aspiration cannot be excluded. No central line is identified. There is no pneumothorax. IMPRESSION: Endotracheal tube and NG tube in place. Hazy atelectasis or infiltrate in right lower lobe. Aspiration cannot be  excluded. No pneumothorax. No central line is identified. Electronically Signed   By: Lahoma Crocker M.D.   On: 12/17/2016 16:52   Dg Chest Port 1 View  Result Date: 12/15/2016 CLINICAL DATA:  Intubated.  Cough. EXAM: PORTABLE CHEST 1 VIEW COMPARISON:  12/24/2016 FINDINGS: There is an endotracheal tube with the tip 5.8 cm above the carina. There is no pleural effusion or pneumothorax. There is no focal consolidation. There is mild cardiomegaly. There is a nasogastric tube coursing below the diaphragm. The osseous structures are unremarkable. IMPRESSION: Endotracheal tube with the tip 5.8 cm above the carina. Electronically Signed   By: Kathreen Devoid   On: 12/28/2016 11:38   US Abdomen Limited Ruq  Result Date: 01/06/2017 CLINICAL DATA:  Elevated bilirubin. EXAM: US ABDOMEN LIMITED - RIGHT UPPER QUADRANT COMPARISON:  None. FINDINGS: Gallbladder: The stone near the gallbladder neck. Dependent gallbladder sludge. No wall thickening. Common bile duct: Diameter: 3.6 mm Liver: Increased parenchymal echogenicity. Normal liver size. No liver mass or focal lesion. Trace amount ascites noted adjacent to the liver underneath the right hemidiaphragm. Small right pleural effusion. IMPRESSION: 1. No intra or extrahepatic bile duct dilation. No evidence of biliary obstruction. 2. Small gallstone in gallbladder sludge, but no sonographic evidence of acute cholecystitis. 3. Increased parenchymal echogenicity of the liver consistent with hepatic steatosis. 4. Trace amount of ascites.  Small right pleural effusion. Electronically Signed   By: Lajean Manes M.D.   On: 01/06/2017 13:42       Hermelinda Dellen, DO  12/31/2016

## 2016-12-31 NOTE — Progress Notes (Signed)
Pharmacy Antibiotic Note  Xavier King is a 71 y.o. male admitted on 12/16/2016 with sepsis. Now found to have positive blood cultures showing E.Coli.  Pharmacy has been consulted for meropenem and vancomycin dosing.  Plan: Scr increase from 1.49 >> 2.23. Dr. Jefferson Fuel wants to continue both Vanc and Meropenem at this time due to patient being immunocompromised and severely ill/on pressors.  Currently receiving vancomycin 1gm IV every 12 hours and  Meropenem 1gm IV every 8 hour.  Ke: 0.034  T1/2: 20.5  Vd: 65  Will adjust vancomycin dose to 1250mg  IV every 24 hours due to change in renal function. Will monitor Scr daily and adjust dose as needed.    Will adjust Meropenem dose to 1 gm IV Q12H based on CrCl <31ml/min  Height: 6\' 1"  (185.4 cm) Weight: 205 lb 11 oz (93.3 kg) IBW/kg (Calculated) : 79.9  Temp (24hrs), Avg:99.7 F (37.6 C), Min:98.1 F (36.7 C), Max:103 F (39.4 C)   Recent Labs Lab 12/23/2016 1023 12/25/2016 1031 01/04/2017 1323 12/31/16 0007 12/31/16 0402  WBC  --  1.1*  --   --  2.4*  CREATININE  --  1.49*  --   --  2.23*  LATICACIDVEN 3.1*  --  2.5* 2.3*  --     Estimated Creatinine Clearance: 35.3 mL/min (by C-G formula based on SCr of 2.23 mg/dL (H)).    No Known Allergies  Thank you for allowing pharmacy to be a part of this patient's care.  Bartosz Luginbill M Adolphe Fortunato, Pharm.D., BCPS Clinical Pharmacist 12/31/2016 8:56 AM

## 2016-12-31 NOTE — Progress Notes (Signed)
Progress Note  Patient Name: Xavier King Date of Encounter: 12/31/2016  Primary Cardiologist: Audelia Acton, new  Subjective   Continued ventilator and pressor support. AF rates improved.  Inpatient Medications    Scheduled Meds: . chlorhexidine gluconate (MEDLINE KIT)  15 mL Mouth Rinse BID  . Chlorhexidine Gluconate Cloth  6 each Topical Q0600  . fentaNYL (SUBLIMAZE) injection  50 mcg Intravenous Once  . hydrocortisone sod succinate (SOLU-CORTEF) inj  100 mg Intravenous Q8H  . insulin aspart  0-9 Units Subcutaneous Q4H  . mouth rinse  15 mL Mouth Rinse QID  . meropenem  1 g Intravenous Q12H  . mupirocin ointment  1 application Nasal BID  . sodium chloride flush  10-40 mL Intracatheter Q12H  . [START ON 01/01/2017] vancomycin  1,250 mg Intravenous Q24H   Continuous Infusions: . amiodarone 30 mg/hr (12/31/16 0800)  . fentaNYL 100 mcg/hr (12/31/16 1030)  . norepinephrine (LEVOPHED) Adult infusion 5.013 mcg/min (12/31/16 0800)  . phenylephrine (NEO-SYNEPHRINE) Adult infusion 65 mcg/min (12/31/16 0919)  . propofol (DIPRIVAN) infusion 10.067 mcg/kg/min (12/31/16 0800)  . vasopressin (PITRESSIN) infusion - *FOR SHOCK* 0.03 Units/min (12/31/16 0800)   PRN Meds: sodium chloride, fentaNYL, fentaNYL (SUBLIMAZE) injection, fentaNYL (SUBLIMAZE) injection   Vital Signs    Vitals:   12/31/16 0700 12/31/16 0800 12/31/16 0900 12/31/16 1000  BP: (!) 87/68 100/78 (!) 88/72 90/79  Pulse: (!) 109 (!) 110 (!) 112 (!) 106  Resp: 20 19 (!) 23 20  Temp: 98.1 F (36.7 C) 97.9 F (36.6 C) 97.9 F (36.6 C) 97.5 F (36.4 C)  TempSrc:  Core (Comment)    SpO2: 96% 98% 97% 98%  Weight:      Height:        Intake/Output Summary (Last 24 hours) at 12/31/16 1143 Last data filed at 12/31/16 0800  Gross per 24 hour  Intake          6756.88 ml  Output              485 ml  Net          6271.88 ml   Filed Weights   12/29/2016 1024 12/31/16 0500  Weight: 197 lb (89.4 kg) 205 lb 11 oz (93.3 kg)      Telemetry    Atrial fibrillation - Personally Reviewed  ECG    Afib, 140, rightward axis, poor R progression, ST dep V5-6. - Personally Reviewed  Physical Exam   GEN: No acute distress.   Neck: No JVD Cardiac: iRRR, no murmurs, rubs, or gallops.  Respiratory: Clear to auscultation bilaterally. GI: Soft, nontender, non-distended  MS: No edema; No deformity. Neuro:  Nonfocal  Psych: Normal affect   Labs    Chemistry Recent Labs Lab 12/27/2016 1031 12/31/16 0402  NA 132* 133*  K 4.7 4.5  CL 99* 102  CO2 22 22  GLUCOSE 251* 317*  BUN 65* 72*  CREATININE 1.49* 2.23*  CALCIUM 8.6* 7.6*  PROT 5.8*  --   ALBUMIN 2.5*  --   AST 106* 51*  ALT 497* 292*  ALKPHOS 71  --   BILITOT 18.9* 17.0*  GFRNONAA 46* 28*  GFRAA 53* 33*  ANIONGAP 11 9     Hematology Recent Labs Lab 01/06/2017 1031 12/31/16 0402  WBC 1.1* 2.4*  RBC 3.01* 2.76*  HGB 9.9* 8.8*  HCT 28.2* 26.5*  MCV 93.8 95.9  MCH 32.8 32.0  MCHC 35.0 33.3  RDW 22.4* 22.5*  PLT 91* 72*    Cardiac Enzymes Recent Labs  Lab 12/31/2016 1031 01/08/2017 1842 12/31/16 0007  TROPONINI <0.03 <0.03 <0.03   No results for input(s): TROPIPOC in the last 168 hours.   BNP Recent Labs Lab 01/10/2017 1032  BNP 2,471.0*     DDimer No results for input(s): DDIMER in the last 168 hours.   Radiology    Dg Abdomen 1 View  Result Date: 12/17/2016 CLINICAL DATA:  Status post OG tube placement EXAM: ABDOMEN - 1 VIEW COMPARISON:  None. FINDINGS: Gastric catheter lies at the level of the gastroesophageal junction. Proximal side port is within the distal esophagus. This should be advanced as clinically indicated. Left renal calculus is noted. No obstructive changes are seen. IMPRESSION: Gastric catheter at the GE junction as described. Left renal calculus. Electronically Signed   By: Inez Catalina M.D.   On: 12/23/2016 11:41   Ct Head Wo Contrast  Result Date: 12/18/2016 CLINICAL DATA:  Episode of confusion since Wednesday,  history myelodysplastic syndrome, atrial fibrillation, hypertension, diabetes mellitus, prior melanoma EXAM: CT HEAD WITHOUT CONTRAST TECHNIQUE: Contiguous axial images were obtained from the base of the skull through the vertex without intravenous contrast. Sagittal and coronal MPR images reconstructed from axial data set. COMPARISON:  None FINDINGS: Brain: Generalized atrophy. Normal ventricular morphology. No midline shift or mass effect. Minimal small vessel chronic ischemic changes of deep cerebral white matter. No intracranial hemorrhage, mass lesion, evidence of acute infarction, or extra-axial fluid collection. Vascular: Atherosclerotic calcifications at the carotid arteries and vertebral Skull: Demineralized but intact Sinuses/Orbits: Mucosal thickening in ethmoid air cells. Remaining paranasal sinuses and mastoid air cells clear. Other: N/A IMPRESSION: Atrophy with minimal small vessel chronic ischemic changes of deep cerebral white matter. No acute intracranial abnormalities. Electronically Signed   By: Lavonia Dana M.D.   On: 12/29/2016 12:17   Dg Chest Port 1 View  Result Date: 12/31/2016 CLINICAL DATA:  Initial evaluation for acute respiratory failure. EXAM: PORTABLE CHEST 1 VIEW COMPARISON:  Prior radiograph from 12/29/2016. FINDINGS: Patient remains intubated with the tip of the endotracheal tube located at the upper margin of the clavicles. Enteric tube overlies the stomach. Stable cardiomegaly. Mediastinal silhouette within normal limits. Aortic atherosclerosis. Persistent hazy and patchy bibasilar opacities, worsened on the left, which may reflect atelectasis and/ or infiltrates. Perihilar vascular congestion without overt pulmonary edema. Suspected small bilateral pleural effusions. No pneumothorax. No acute osseous abnormality. IMPRESSION: 1. Support apparatus in satisfactory position. 2. Persistent hazy and patchy bibasilar opacities, worsened on the left. Findings may reflect atelectasis  and/or infiltrates. 3. Suspected small bilateral pleural effusions. Electronically Signed   By: Jeannine Boga M.D.   On: 12/31/2016 03:57   Dg Chest Port 1 View  Result Date: 12/18/2016 CLINICAL DATA:  Right side groin central line placement EXAM: PORTABLE CHEST 1 VIEW COMPARISON:  12/31/2016 FINDINGS: Cardiomegaly is noted. Stable endotracheal and NG tube position. There is hazy atelectasis or infiltrate in right base. Aspiration cannot be excluded. No central line is identified. There is no pneumothorax. IMPRESSION: Endotracheal tube and NG tube in place. Hazy atelectasis or infiltrate in right lower lobe. Aspiration cannot be excluded. No pneumothorax. No central line is identified. Electronically Signed   By: Lahoma Crocker M.D.   On: 01/04/2017 16:52   Dg Chest Port 1 View  Result Date: 01/06/2017 CLINICAL DATA:  Intubated.  Cough. EXAM: PORTABLE CHEST 1 VIEW COMPARISON:  12/19/2016 FINDINGS: There is an endotracheal tube with the tip 5.8 cm above the carina. There is no pleural effusion or pneumothorax. There is no  focal consolidation. There is mild cardiomegaly. There is a nasogastric tube coursing below the diaphragm. The osseous structures are unremarkable. IMPRESSION: Endotracheal tube with the tip 5.8 cm above the carina. Electronically Signed   By: Kathreen Devoid   On: 12/27/2016 11:38   US Abdomen Limited Ruq  Result Date: 12/31/2016 CLINICAL DATA:  Elevated bilirubin. EXAM: US ABDOMEN LIMITED - RIGHT UPPER QUADRANT COMPARISON:  None. FINDINGS: Gallbladder: The stone near the gallbladder neck. Dependent gallbladder sludge. No wall thickening. Common bile duct: Diameter: 3.6 mm Liver: Increased parenchymal echogenicity. Normal liver size. No liver mass or focal lesion. Trace amount ascites noted adjacent to the liver underneath the right hemidiaphragm. Small right pleural effusion. IMPRESSION: 1. No intra or extrahepatic bile duct dilation. No evidence of biliary obstruction. 2. Small  gallstone in gallbladder sludge, but no sonographic evidence of acute cholecystitis. 3. Increased parenchymal echogenicity of the liver consistent with hepatic steatosis. 4. Trace amount of ascites.  Small right pleural effusion. Electronically Signed   By: Lajean Manes M.D.   On: 01/06/2017 13:42    Cardiac Studies   TTE pending  Patient Profile     71 y/o ? with a reported h/o chronic afib on eliquis, HTN, DMII, cardiomyopathy (? ICM vs NICM), myelodysplastic syndrome, and melanoma, who presented to the [facility] ED on 2/16 with a 2 day h/o confusion with worsening mental status, fever, and afib RVR.  Assessment & Plan    1. Sepsis/Septic Shock:  Pt presented 2/16 with a 2 day h/o progressively altered mentation after recent hospitalization in [place].  Found to be tachy, febrile, hypoxic, and hypotensive.  Lactic acid 3.1. PCT 4.74.  Code sepsis mgmt, abx/pressors per CCM.  2.  Afib RVR:  Per notes, he has chronic afib and had recently been switched from coumadin to eliquis.  Rates elevated in the setting of above.  Cont IV amio for rate control.  Anticoagulation on hold in setting of anemia/thrombocytopenia.  Would add heparin once stable or eliquis once taking PO's.  Continue amiodarone.  3.  Cardiomyopathy:  Not clear at this point if this is ischemic or nonishcemic.  EF reportedly 30-35%.  BNP elevated.  Mildly volume overloaded on exam.  No diuresis in setting of #1.  Follow CVP via central line.  4.  Acute hypoxic resp failure:  In setting of above.  Vent mgmt per IM.  5.  AKI:  Creat 1.49 on admission. Creatinine worsening, prognosis poor.  6.  Myelodysplastic syndrome:  Follow.  Signed, Aarik Blank Meredith Leeds, MD  12/31/2016, 11:43 AM

## 2017-01-01 ENCOUNTER — Inpatient Hospital Stay: Payer: Managed Care, Other (non HMO)

## 2017-01-01 LAB — COMPREHENSIVE METABOLIC PANEL
ALT: 211 U/L — ABNORMAL HIGH (ref 17–63)
ANION GAP: 12 (ref 5–15)
AST: 40 U/L (ref 15–41)
Albumin: 1.8 g/dL — ABNORMAL LOW (ref 3.5–5.0)
Alkaline Phosphatase: 60 U/L (ref 38–126)
BUN: 88 mg/dL — ABNORMAL HIGH (ref 6–20)
CHLORIDE: 103 mmol/L (ref 101–111)
CO2: 22 mmol/L (ref 22–32)
Calcium: 7.7 mg/dL — ABNORMAL LOW (ref 8.9–10.3)
Creatinine, Ser: 2.31 mg/dL — ABNORMAL HIGH (ref 0.61–1.24)
GFR calc non Af Amer: 27 mL/min — ABNORMAL LOW (ref >60)
GFR, EST AFRICAN AMERICAN: 31 mL/min — AB (ref >60)
Glucose, Bld: 275 mg/dL — ABNORMAL HIGH (ref 65–99)
POTASSIUM: 4.6 mmol/L (ref 3.5–5.1)
Sodium: 137 mmol/L (ref 135–145)
TOTAL PROTEIN: 5.1 g/dL — AB (ref 6.5–8.1)
Total Bilirubin: 18.4 mg/dL (ref 0.3–1.2)

## 2017-01-01 LAB — CBC
HCT: 28.3 % — ABNORMAL LOW (ref 40.0–52.0)
Hemoglobin: 9.8 g/dL — ABNORMAL LOW (ref 13.0–18.0)
MCH: 32.3 pg (ref 26.0–34.0)
MCHC: 34.7 g/dL (ref 32.0–36.0)
MCV: 92.9 fL (ref 80.0–100.0)
Platelets: 88 10*3/uL — ABNORMAL LOW (ref 150–440)
RBC: 3.04 MIL/uL — ABNORMAL LOW (ref 4.40–5.90)
RDW: 22.4 % — ABNORMAL HIGH (ref 11.5–14.5)
WBC: 4.3 10*3/uL (ref 3.8–10.6)

## 2017-01-01 LAB — GLUCOSE, CAPILLARY
GLUCOSE-CAPILLARY: 236 mg/dL — AB (ref 65–99)
GLUCOSE-CAPILLARY: 248 mg/dL — AB (ref 65–99)
GLUCOSE-CAPILLARY: 255 mg/dL — AB (ref 65–99)
Glucose-Capillary: 228 mg/dL — ABNORMAL HIGH (ref 65–99)
Glucose-Capillary: 242 mg/dL — ABNORMAL HIGH (ref 65–99)
Glucose-Capillary: 247 mg/dL — ABNORMAL HIGH (ref 65–99)
Glucose-Capillary: 255 mg/dL — ABNORMAL HIGH (ref 65–99)

## 2017-01-01 LAB — URINE CULTURE: Culture: 50000 — AB

## 2017-01-01 LAB — HAPTOGLOBIN: HAPTOGLOBIN: 50 mg/dL (ref 34–200)

## 2017-01-01 LAB — LACTIC ACID, PLASMA
LACTIC ACID, VENOUS: 1.9 mmol/L (ref 0.5–1.9)
Lactic Acid, Venous: 2.7 mmol/L (ref 0.5–1.9)

## 2017-01-01 LAB — BILIRUBIN, DIRECT: Bilirubin, Direct: 10.3 mg/dL — ABNORMAL HIGH (ref 0.1–0.5)

## 2017-01-01 LAB — PROCALCITONIN: Procalcitonin: 14.08 ng/mL

## 2017-01-01 MED ORDER — CHLORHEXIDINE GLUCONATE 0.12% ORAL RINSE (MEDLINE KIT)
15.0000 mL | Freq: Two times a day (BID) | OROMUCOSAL | Status: DC
  Administered 2017-01-02 – 2017-01-05 (×8): 15 mL via OROMUCOSAL

## 2017-01-01 MED ORDER — AMIODARONE IV BOLUS ONLY 150 MG/100ML
150.0000 mg | Freq: Once | INTRAVENOUS | Status: AC
  Administered 2017-01-01: 150 mg via INTRAVENOUS
  Filled 2017-01-01: qty 100

## 2017-01-01 MED ORDER — AMIODARONE LOAD VIA INFUSION
150.0000 mg | Freq: Once | INTRAVENOUS | Status: AC
  Administered 2017-01-01: 150 mg via INTRAVENOUS

## 2017-01-01 MED ORDER — FAMOTIDINE IN NACL 20-0.9 MG/50ML-% IV SOLN
20.0000 mg | INTRAVENOUS | Status: DC
  Administered 2017-01-01 – 2017-01-02 (×2): 20 mg via INTRAVENOUS
  Filled 2017-01-01 (×2): qty 50

## 2017-01-01 MED ORDER — INSULIN ASPART 100 UNIT/ML ~~LOC~~ SOLN
0.0000 [IU] | SUBCUTANEOUS | Status: DC
  Administered 2017-01-02 (×2): 11 [IU] via SUBCUTANEOUS
  Administered 2017-01-02: 7 [IU] via SUBCUTANEOUS
  Filled 2017-01-01: qty 11
  Filled 2017-01-01: qty 7
  Filled 2017-01-01: qty 11

## 2017-01-01 MED ORDER — ORAL CARE MOUTH RINSE
15.0000 mL | OROMUCOSAL | Status: DC
  Administered 2017-01-02 – 2017-01-05 (×36): 15 mL via OROMUCOSAL

## 2017-01-01 MED ORDER — METOPROLOL TARTRATE 5 MG/5ML IV SOLN
INTRAVENOUS | Status: AC
  Administered 2017-01-01: 5 mg via INTRAVENOUS
  Filled 2017-01-01: qty 5

## 2017-01-01 MED ORDER — SODIUM CHLORIDE 0.9 % IV BOLUS (SEPSIS)
1000.0000 mL | Freq: Once | INTRAVENOUS | Status: AC
  Administered 2017-01-01: 1000 mL via INTRAVENOUS

## 2017-01-01 MED ORDER — METOPROLOL TARTRATE 5 MG/5ML IV SOLN
5.0000 mg | Freq: Once | INTRAVENOUS | Status: AC
  Administered 2017-01-01: 5 mg via INTRAVENOUS
  Filled 2017-01-01: qty 5

## 2017-01-01 MED ORDER — METOPROLOL TARTRATE 5 MG/5ML IV SOLN
5.0000 mg | Freq: Once | INTRAVENOUS | Status: AC
Start: 2017-01-01 — End: 2017-01-01
  Administered 2017-01-01: 5 mg via INTRAVENOUS

## 2017-01-01 NOTE — Progress Notes (Signed)
Pharmacy Antibiotic Note  Xavier King is a 71 y.o. male admitted on 01/02/2017 with sepsis. Now found to have positive blood cultures showing E.Coli.  Pharmacy has been consulted for meropenem and vancomycin dosing.  Plan: Dr. Jefferson Fuel wants to continue both Vanc and Meropenem at this time due to patient being immunocompromised and severely ill/on pressors.    Will continue vancomycin 1250mg  IV every 24 hours. Will monitor Scr daily and adjust dose as needed.   Will continue meropenem 1 g iv q 12 hours.   Height: 6\' 1"  (185.4 cm) Weight: 205 lb 14.6 oz (93.4 kg) IBW/kg (Calculated) : 79.9  Temp (24hrs), Avg:97.9 F (36.6 C), Min:97.2 F (36.2 C), Max:98.4 F (36.9 C)   Recent Labs Lab 12/23/2016 1023 01/04/2017 1031 01/04/2017 1323 12/31/16 0007 12/31/16 0402 12/31/16 1726 01/01/17 0400  WBC  --  1.1*  --   --  2.4*  --  4.3  CREATININE  --  1.49*  --   --  2.23*  --  2.31*  LATICACIDVEN 3.1*  --  2.5* 2.3*  --  2.2* 2.7*    Estimated Creatinine Clearance: 34.1 mL/min (by C-G formula based on SCr of 2.31 mg/dL (H)).    No Known Allergies  Thank you for allowing pharmacy to be a part of this patient's care.  Ulice Dash D, PharmD Clinical Pharmacist 01/01/2017 7:30 AM

## 2017-01-01 NOTE — Progress Notes (Signed)
Per Dr Jefferson Fuel no WUA today or yesterday, pt does not appear ready to tolerate WUA or weaning attempt

## 2017-01-01 NOTE — Progress Notes (Signed)
Per Dr Jefferson Fuel order 5 mg lopressor x 1

## 2017-01-01 NOTE — Progress Notes (Signed)
Pharmacy Antibiotic Note  Xavier King is a 71 y.o. male admitted on 12/17/2016 with sepsis. Now found to have positive blood cultures showing E.Coli.  Pharmacy has been consulted for meropenem and vancomycin dosing.  Plan: Dr. Jefferson Fuel wants to continue both Vanc and Meropenem at this time due to patient being immunocompromised and severely ill/on pressors.   Will continue vancomycin 1250mg  IV every 24 hours. Will monitor Scr daily and adjust dose as needed.   Will continue meropenem 1 g IV Q12hr., Ecoli in urine is pansensitive, recommend narrowing as appropriate.   Temp (24hrs), Avg:98 F (36.7 C), Min:97.2 F (36.2 C), Max:98.4 F (36.9 C)   Recent Labs Lab 12/15/2016 1023 12/21/2016 1031 12/15/2016 1323 12/31/16 0007 12/31/16 0402 12/31/16 1726 01/01/17 0400  WBC  --  1.1*  --   --  2.4*  --  4.3  CREATININE  --  1.49*  --   --  2.23*  --  2.31*  LATICACIDVEN 3.1*  --  2.5* 2.3*  --  2.2* 2.7*    Estimated Creatinine Clearance: 34.1 mL/min (by C-G formula based on SCr of 2.31 mg/dL (H)).    No Known Allergies   Antibiotics:  Cefepime 2/16 >> 2/16 Meropenem 2/17 >> Vancomycin 2/16 >>  Microbiology:  2/16 UCx: Ecoli - pansensitive, 50K colonies  2/16 BCx: Ecoli  2/15 MRSA PCR: Positive   Pharmacy will continue to monitor and adjust per consult.   Yessica Putnam L 01/01/2017 8:53 AM

## 2017-01-01 NOTE — Progress Notes (Signed)
Initial Nutrition Assessment  DOCUMENTATION CODES:   Not applicable  INTERVENTION:  -If unable to extubate within 24-48 hours, recommend Vital 1.2 begin @ 46mL/hr, increase by 10 every 8 hours to goal rate of 55mL/hr -At goal, with propofol @ 10mL/hr provides 2083 calories, 117gm protein, and 1265cc free water  NUTRITION DIAGNOSIS:   Inadequate oral intake related to inability to eat as evidenced by NPO status.  GOAL:   Patient will meet greater than or equal to 90% of their needs  MONITOR:   Labs, Weight trends, Vent status, I & O's, TF tolerance  REASON FOR ASSESSMENT:   Ventilator, Malnutrition Screening Tool    ASSESSMENT:   71 yo male who presents via EMS with altered mental status, became confused 2/14, has not eaten since 2/11. PMH of A-fib, myelodysplastic syndrome, DM, Melanoma.  Patient is currently intubated on ventilator support MV: 9 L/min Temp (24hrs), Avg:98 F (36.7 C), Min:97.2 F (36.2 C), Max:98.4 F (36.9 C) Propofol: 8 ml/hr --> 211 calories Labs and medications reviewed: CBGs 255, 247; TBili 18.4;  Novolog 5 units Q4H Amiodarine gtt, Fentanyl gtt, levo gtt, Neo gtt, Vaso gtt Likely malnourished given PO intake x7d ays, but unable to diagnose at this time.  Diet Order:  Diet NPO time specified  Skin:  Reviewed, no issues  Last BM:  PTA  Height:   Ht Readings from Last 1 Encounters:  01/01/2017 6\' 1"  (1.854 m)    Weight:   Wt Readings from Last 1 Encounters:  01/01/17 205 lb 14.6 oz (93.4 kg)    Ideal Body Weight:  83.63 kg  BMI:  Body mass index is 27.17 kg/m.  Estimated Nutritional Needs:   Kcal:  1916 calories  Protein:  112-140 gm  Fluid:  Per Nephrology  EDUCATION NEEDS:   No education needs identified at this time  Satira Anis. Beatriz Settles, MS, RD LDN Inpatient Clinical Dietitian Pager [phone]

## 2017-01-01 NOTE — Progress Notes (Signed)
Pt was less responsive this shift, attempted to wean sedation and pressors slightly, but HR increased to Afib RVR that was not responsive to lopressor, did respond somewhat to amiodarone bolus x 1.  When sedation was reduced patient opened his eyes and was responsive to his family.  Attempted to wean lovophed to 6 mcg, but SBP decreased into the low 80s, titrated levo back to 8 mcg. Remains very jaundiced.  UOP 335 ccs at 0800, but sharply decreased after that. BS more active, but no BM this shift.  No changes to vent settings. Breath sounds are clear. Family continues to be very concerned and stay with him most of the time at bedside.

## 2017-01-01 NOTE — Progress Notes (Addendum)
[facility] Progess Note    ASSESSMENT/PLAN   Septic shock: Blood cultures positive for gram-negative rods, Enterobacter presently on meropenem and vancomycin. Has received fluid boluses, empiric hydrocortisone. Presently on vasopressin, norepinephrine and Neo-Synephrine. Weaning as tolerated first weaning the norepinephrine secondary to tachycardia rapid atrial fibrillation  Respiratory failure: Presently on PRBC, 30% FiO2 with a target title volume 500. Stable arterial blood gas  Atrial fibrillation: Rapid atrial fibrillation with a ventricular response approximately 100-110. Presently on amiodarone infusion, appreciate cardiology's assistance and input, will continue amiodarone  Cardiomyopathy: History of cardiomyopathy with ejection fraction of 30-35%  Pancytopenia: Underlying history of mild dysplastic syndrome. We'll hold transfusion of blood until his hemoglobin gets less than 7 and platelets less than 20 or any evidence of active bleeding.  Obstructive jaundice: Total bilirubin has decreased to 18.4, no evidence of obstruction on the right upper quadrant ultrasound, fatty liver noted.  Renal failure: Worsening renal function with a BUN of 88 and creatinine of 2.3. Most likely sepsis, ATN. At this time patient does not need hemodialysis. Will obtain renal ultrasound.   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 Over the last 24 hours Had episode of desaturation and tachycardia after receiving a bath. Required brief increase in his FiO2 and subsequently weaned back down. Additional bolus of amiodarone given. Presently his ventricular responses 110-120    VITAL SIGNS: Temp:  [97.2 F (36.2  C)-98.4 F (36.9 C)] 97.2 F (36.2 C) (02/18 0700) Pulse Rate:  [71-137] 127 (02/18 0700) Resp:  [17-24] 21 (02/18 0700) BP: (73-119)/(52-103) 115/103 (02/18 0700) SpO2:  [91 %-100 %] 99 % (02/18 0700) FiO2 (%):  [30 %] 30 % (02/18 0804) Weight:  [93.4 kg (205 lb 14.6 oz)] 93.4 kg (205 lb 14.6 oz) (02/18 0430) HEMODYNAMICS: CVP:  [7 mmHg-10 mmHg] 7 mmHg VENTILATOR SETTINGS: Vent Mode: PRVC FiO2 (%):  [30 %] 30 % Set Rate:  [20 bmp] 20 bmp Vt Set:  [500 mL] 500 mL PEEP:  [5 cmH20] 5 cmH20 Plateau Pressure:  [12 cmH20-16 cmH20] 14 cmH20 INTAKE / OUTPUT:  Intake/Output Summary (Last 24 hours) at 01/01/17 0812 Last data filed at 01/01/17 0600  Gross per 24 hour  Intake          2309.43 ml  Output              665 ml  Net          1644.43 ml    PHYSICAL EXAMINATION: Physical Examination:   VS: BP (!) 115/103   Pulse (!) 127   Temp 97.2 F (36.2 C)   Resp (!) 21   Ht 6\' 1"  (1.854 m)   Wt 93.4 kg (205 lb 14.6 oz)   SpO2 99%   BMI 27.17 kg/m   General Appearance: No distress. Presently on fentanyl and propofol for sedation Neuro: Limited exam secondary to sedative agents HEENT: his pupils appear equal and reactive today. Pulmonary: Coarse rhonchi in pleural rub appreciated bilaterally. CardiovascularNormal S1,S2.  No m/r/g.   Abdomen: Benign, Soft, non-tender. Endocrine: No evident thyromegaly. Skin:   warm, no rashes, no ecchymosis  Extremities: normal, no cyanosis, clubbing.   LABS: Chest x-ray reveals prominent cardiac silhouette with right infrahilar parenchymal infiltrates.   LABORATORY PANEL:  CBC  Recent Labs Lab 01/01/17 0400  WBC 4.3  HGB 9.8*  HCT 28.3*  PLT 88*    Chemistries   Recent Labs Lab 12/31/16 0402 01/01/17 0400  NA 133* 137  K 4.5 4.6  CL 102 103  CO2 22 22  GLUCOSE 317* 275*  BUN 72* 88*  CREATININE 2.23* 2.31*  CALCIUM 7.6* 7.7*  MG 1.9  --   PHOS UNABLE TO REPORT DUE TO ICTERUS  --   AST 51* 40  ALT 292* 211*    ALKPHOS  --  60  BILITOT 17.0* 18.4*     Recent Labs Lab 12/31/16 1204 12/31/16 1606 12/31/16 1955 01/01/17 0019 01/01/17 0423 01/01/17 0749  GLUCAP 270* 255* 279* 255* 247* 255*    Recent Labs Lab 12/27/2016 1208 12/15/2016 1830 12/31/16 0500  PHART 7.43 7.32* 7.34*  PCO2ART 31* 35 39  PO2ART 174* 124* 120*    Recent Labs Lab 12/28/2016 1031 12/31/16 0402 01/01/17 0400  AST 106* 51* 40  ALT 497* 292* 211*  ALKPHOS 71  --  60  BILITOT 18.9* 17.0* 18.4*  ALBUMIN 2.5*  --  1.8*    Cardiac Enzymes  Recent Labs Lab 12/31/16 0007  TROPONINI <0.03    RADIOLOGY:  Dg Abdomen 1 View  Result Date: 12/23/2016 CLINICAL DATA:  Status post OG tube placement EXAM: ABDOMEN - 1 VIEW COMPARISON:  None. FINDINGS: Gastric catheter lies at the level of the gastroesophageal junction. Proximal side port is within the distal esophagus. This should be advanced as clinically indicated. Left renal calculus is noted. No obstructive changes are seen. IMPRESSION: Gastric catheter at the GE junction as described. Left renal calculus. Electronically Signed   By: Inez Catalina M.D.   On: 01/03/2017 11:41   Ct Head Wo Contrast  Result Date: 12/23/2016 CLINICAL DATA:  Episode of confusion since Wednesday, history myelodysplastic syndrome, atrial fibrillation, hypertension, diabetes mellitus, prior melanoma EXAM: CT HEAD WITHOUT CONTRAST TECHNIQUE: Contiguous axial images were obtained from the base of the skull through the vertex without intravenous contrast. Sagittal and coronal MPR images reconstructed from axial data set. COMPARISON:  None FINDINGS: Brain: Generalized atrophy. Normal ventricular morphology. No midline shift or mass effect. Minimal small vessel chronic ischemic changes of deep cerebral white matter. No intracranial hemorrhage, mass lesion, evidence of acute infarction, or extra-axial fluid collection. Vascular: Atherosclerotic calcifications at the carotid arteries and vertebral  Skull: Demineralized but intact Sinuses/Orbits: Mucosal thickening in ethmoid air cells. Remaining paranasal sinuses and mastoid air cells clear. Other: N/A IMPRESSION: Atrophy with minimal small vessel chronic ischemic changes of deep cerebral white matter. No acute intracranial abnormalities. Electronically Signed   By: Lavonia Dana M.D.   On: 12/18/2016 12:17   US Renal  Result Date: 12/31/2016 CLINICAL DATA:  Acute renal failure EXAM: RENAL / URINARY TRACT ULTRASOUND COMPLETE COMPARISON:  None. FINDINGS: Right Kidney: Length: 13.6 cm. No hydronephrosis. 4 mm nonobstructing interpolar stone. 12 mm interpolar cortical cyst. Left Kidney: Length: 13.6 cm. 5 mm nonobstructing interpolar stone. No hydronephrosis. No gross mass lesion. Bladder: Decompressed by Foley catheter. Note:  Small volume ascites evident. IMPRESSION: No hydronephrosis. Electronically Signed   By: Misty Stanley M.D.   On: 12/31/2016 17:14   Dg Chest Port 1 View  Result Date: 01/01/2017 CLINICAL DATA:  Acute respiratory failure EXAM: PORTABLE CHEST 1 VIEW COMPARISON:  12/31/2016 FINDINGS: Endotracheal tube with tip measuring 4.5 cm above the carina. Enteric tube tip is off the field of view but below the left  hemidiaphragm. Mild cardiac enlargement without vascular congestion. Bilateral basilar infiltrates, greater on the right. Probable small pleural effusions. No pneumothorax. No change since prior study. IMPRESSION: Appliances appear in satisfactory location. Bilateral basilar infiltrates and effusions, greater on the right. No significant change. Electronically Signed   By: Lucienne Capers M.D.   On: 01/01/2017 05:35   Dg Chest Port 1 View  Result Date: 12/31/2016 CLINICAL DATA:  Initial evaluation for acute respiratory failure. EXAM: PORTABLE CHEST 1 VIEW COMPARISON:  Prior radiograph from 12/18/2016. FINDINGS: Patient remains intubated with the tip of the endotracheal tube located at the upper margin of the clavicles. Enteric  tube overlies the stomach. Stable cardiomegaly. Mediastinal silhouette within normal limits. Aortic atherosclerosis. Persistent hazy and patchy bibasilar opacities, worsened on the left, which may reflect atelectasis and/ or infiltrates. Perihilar vascular congestion without overt pulmonary edema. Suspected small bilateral pleural effusions. No pneumothorax. No acute osseous abnormality. IMPRESSION: 1. Support apparatus in satisfactory position. 2. Persistent hazy and patchy bibasilar opacities, worsened on the left. Findings may reflect atelectasis and/or infiltrates. 3. Suspected small bilateral pleural effusions. Electronically Signed   By: Jeannine Boga M.D.   On: 12/31/2016 03:57   Dg Chest Port 1 View  Result Date: 01/01/2017 CLINICAL DATA:  Right side groin central line placement EXAM: PORTABLE CHEST 1 VIEW COMPARISON:  12/26/2016 FINDINGS: Cardiomegaly is noted. Stable endotracheal and NG tube position. There is hazy atelectasis or infiltrate in right base. Aspiration cannot be excluded. No central line is identified. There is no pneumothorax. IMPRESSION: Endotracheal tube and NG tube in place. Hazy atelectasis or infiltrate in right lower lobe. Aspiration cannot be excluded. No pneumothorax. No central line is identified. Electronically Signed   By: Lahoma Crocker M.D.   On: 12/31/2016 16:52   Dg Chest Port 1 View  Result Date: 01/04/2017 CLINICAL DATA:  Intubated.  Cough. EXAM: PORTABLE CHEST 1 VIEW COMPARISON:  12/23/2016 FINDINGS: There is an endotracheal tube with the tip 5.8 cm above the carina. There is no pleural effusion or pneumothorax. There is no focal consolidation. There is mild cardiomegaly. There is a nasogastric tube coursing below the diaphragm. The osseous structures are unremarkable. IMPRESSION: Endotracheal tube with the tip 5.8 cm above the carina. Electronically Signed   By: Kathreen Devoid   On: 12/28/2016 11:38   US Abdomen Limited Ruq  Result Date: 12/25/2016 CLINICAL  DATA:  Elevated bilirubin. EXAM: US ABDOMEN LIMITED - RIGHT UPPER QUADRANT COMPARISON:  None. FINDINGS: Gallbladder: The stone near the gallbladder neck. Dependent gallbladder sludge. No wall thickening. Common bile duct: Diameter: 3.6 mm Liver: Increased parenchymal echogenicity. Normal liver size. No liver mass or focal lesion. Trace amount ascites noted adjacent to the liver underneath the right hemidiaphragm. Small right pleural effusion. IMPRESSION: 1. No intra or extrahepatic bile duct dilation. No evidence of biliary obstruction. 2. Small gallstone in gallbladder sludge, but no sonographic evidence of acute cholecystitis. 3. Increased parenchymal echogenicity of the liver consistent with hepatic steatosis. 4. Trace amount of ascites.  Small right pleural effusion. Electronically Signed   By: Lajean Manes M.D.   On: 12/23/2016 13:42       Hermelinda Dellen, DO  01/01/2017

## 2017-01-01 NOTE — Consult Note (Signed)
Hepatology Inpatient Follow-up Note  Patient Identification: Xavier King is a 71 y.o. male with septic shock secondary to bacteremia, respiratory and renal failure noted to have hyperbilirubinemia.  More unstable today (tachycardia and hypotension) after a bath this am.  Bilirubin this am remains elevated >18. Other liver studies are improved since admission.   Subjective:  Scheduled Inpatient Medications:  . chlorhexidine gluconate (MEDLINE KIT)  15 mL Mouth Rinse BID  . Chlorhexidine Gluconate Cloth  6 each Topical Q0600  . famotidine (PEPCID) IV  20 mg Intravenous Q24H  . fentaNYL (SUBLIMAZE) injection  50 mcg Intravenous Once  . hydrocortisone sod succinate (SOLU-CORTEF) inj  100 mg Intravenous Q8H  . insulin aspart  0-9 Units Subcutaneous Q4H  . mouth rinse  15 mL Mouth Rinse QID  . meropenem  1 g Intravenous Q12H  . mupirocin ointment  1 application Nasal BID  . sodium chloride flush  10-40 mL Intracatheter Q12H  . vancomycin  1,250 mg Intravenous Q24H    Continuous Inpatient Infusions:   . amiodarone 30 mg/hr (01/01/17 0913)  . fentaNYL 125 mcg/hr (01/01/17 0800)  . norepinephrine (LEVOPHED) Adult infusion 8.96 mcg/min (01/01/17 0800)  . phenylephrine (NEO-SYNEPHRINE) Adult infusion 80 mcg/min (01/01/17 0800)  . propofol (DIPRIVAN) infusion 15 mcg/kg/min (01/01/17 0913)  . vasopressin (PITRESSIN) infusion - *FOR SHOCK* 0.03 Units/min (01/01/17 0800)    PRN Inpatient Medications:  sodium chloride, fentaNYL, fentaNYL (SUBLIMAZE) injection, fentaNYL (SUBLIMAZE) injection  Review of Systems:  None obtained since patient not conscious   Physical Examination: BP (!) 115/103   Pulse (!) 127   Temp 97.2 F (36.2 C)   Resp (!) 21   Ht _0  (1.854 m)   Wt 93.4 kg (205 lb 14.6 oz)   SpO2 99%   BMI 27.17 kg/m  Gen: NAD, alert and oriented x 4 HEENT: PEERLA, EOMI, Neck: supple, no JVD or thyromegaly Chest: CTA bilaterally, no wheezes, crackles, or other  adventitious sounds CV: RRR, no m/g/c/r Abd: soft, NT, ND, +BS in all four quadrants; no HSM, guarding, ridigity, or rebound tenderness Ext: no edema Skin: no rash or lesions noted   Data: Lab Results  Component Value Date   WBC 4.3 01/01/2017   HGB 9.8 (L) 01/01/2017   HCT 28.3 (L) 01/01/2017   MCV 92.9 01/01/2017   PLT 88 (L) 01/01/2017    Recent Labs Lab 12/29/2016 1031 12/31/16 0402 01/01/17 0400  HGB 9.9* 8.8* 9.8*   Lab Results  Component Value Date   NA 137 01/01/2017   K 4.6 01/01/2017   CL 103 01/01/2017   CO2 22 01/01/2017   BUN 88 (H) 01/01/2017   CREATININE 2.31 (H) 01/01/2017   Lab Results  Component Value Date   ALT 211 (H) 01/01/2017   AST 40 01/01/2017   ALKPHOS 60 01/01/2017   BILITOT 18.4 (HH) 01/01/2017    Recent Labs Lab 01/11/2017 1031  INR 2.40    Assessment/Plan: Xavier King is a 71 y.o. male with multiorgan failure with hyperbilirubinemia. Nonobstructive jaundice. Repeat liver tests today shows improvement with ALT, AST and Alk phosphatase supporting elevated bilirubin due to sepsis, hypoperfusion and now decreased renal clearance.   Recommendations: Continue supportive care  Please call with questions or concerns.  San Jetty, MD

## 2017-01-01 NOTE — Progress Notes (Addendum)
Per Dr Jefferson Fuel give 5 mgs lopressor for Hr in 140s.  If lopressor not effective bolus amiodarone x 1

## 2017-01-01 NOTE — Progress Notes (Signed)
[facility] Progress Note Patient Name: Xavier King DOB: 08/21/1946 MRN: [id number]   Date of Service  01/01/2017  HPI/Events of Note  On the vent. Needs SUP  eICU Interventions  SUP ordered     Intervention Category Intermediate Interventions: Best-practice therapies (e.g. DVT, beta blocker, etc.)  [place] 01/01/2017, 2:30 AM

## 2017-01-02 ENCOUNTER — Inpatient Hospital Stay: Payer: Managed Care, Other (non HMO)

## 2017-01-02 LAB — GLUCOSE, CAPILLARY
GLUCOSE-CAPILLARY: 261 mg/dL — AB (ref 65–99)
GLUCOSE-CAPILLARY: 247 mg/dL — AB (ref 65–99)
GLUCOSE-CAPILLARY: 261 mg/dL — AB (ref 65–99)
GLUCOSE-CAPILLARY: 225 mg/dL — AB (ref 65–99)
GLUCOSE-CAPILLARY: 233 mg/dL — AB (ref 65–99)
GLUCOSE-CAPILLARY: 161 mg/dL — AB (ref 65–99)
GLUCOSE-CAPILLARY: 146 mg/dL — AB (ref 65–99)
Glucose-Capillary: 278 mg/dL — ABNORMAL HIGH (ref 65–99)
Glucose-Capillary: 255 mg/dL — ABNORMAL HIGH (ref 65–99)
Glucose-Capillary: 246 mg/dL — ABNORMAL HIGH (ref 65–99)
Glucose-Capillary: 215 mg/dL — ABNORMAL HIGH (ref 65–99)
Glucose-Capillary: 181 mg/dL — ABNORMAL HIGH (ref 65–99)
Glucose-Capillary: 199 mg/dL — ABNORMAL HIGH (ref 65–99)
Glucose-Capillary: 143 mg/dL — ABNORMAL HIGH (ref 65–99)

## 2017-01-02 LAB — BLOOD GAS, ARTERIAL
Acid-base deficit: 7 mmol/L — ABNORMAL HIGH (ref 0.0–2.0)
Allens test (pass/fail): POSITIVE — AB
BICARBONATE: 19.2 mmol/L — AB (ref 20.0–28.0)
FIO2: 0.3
MECHVT: 500 mL
O2 SAT: 88.8 %
PATIENT TEMPERATURE: 37
PCO2 ART: 40 mmHg (ref 32.0–48.0)
PEEP/CPAP: 5 cmH2O
PH ART: 7.29 — AB (ref 7.350–7.450)
PO2 ART: 63 mmHg — AB (ref 83.0–108.0)
RATE: 20 resp/min

## 2017-01-02 LAB — RENAL FUNCTION PANEL
ALBUMIN: 1.8 g/dL — AB (ref 3.5–5.0)
ANION GAP: 10 (ref 5–15)
BUN: 104 mg/dL — AB (ref 6–20)
CALCIUM: 7.7 mg/dL — AB (ref 8.9–10.3)
CO2: 21 mmol/L — AB (ref 22–32)
Chloride: 106 mmol/L (ref 101–111)
Creatinine, Ser: 2.62 mg/dL — ABNORMAL HIGH (ref 0.61–1.24)
GFR calc Af Amer: 27 mL/min — ABNORMAL LOW (ref >60)
GFR calc non Af Amer: 23 mL/min — ABNORMAL LOW (ref >60)
GLUCOSE: 236 mg/dL — AB (ref 65–99)
Potassium: 4.4 mmol/L (ref 3.5–5.1)
SODIUM: 137 mmol/L (ref 135–145)

## 2017-01-02 LAB — CBC WITH DIFFERENTIAL/PLATELET
BASOS ABS: 0 10*3/uL (ref 0–0.1)
BASOS PCT: 2 %
Band Neutrophils: 4 %
Blasts: 0 %
Eosinophils Absolute: 0 10*3/uL (ref 0–0.7)
Eosinophils Relative: 0 %
HEMATOCRIT: 28.2 % — AB (ref 40.0–52.0)
HEMOGLOBIN: 9.9 g/dL — AB (ref 13.0–18.0)
LYMPHS PCT: 30 %
Lymphs Abs: 0.3 10*3/uL — ABNORMAL LOW (ref 1.0–3.6)
MCH: 32.8 pg (ref 26.0–34.0)
MCHC: 35 g/dL (ref 32.0–36.0)
MCV: 93.8 fL (ref 80.0–100.0)
Metamyelocytes Relative: 0 %
Monocytes Absolute: 0.1 10*3/uL — ABNORMAL LOW (ref 0.2–1.0)
Monocytes Relative: 6 %
Myelocytes: 0 %
NEUTROS ABS: 0.7 10*3/uL — AB (ref 1.4–6.5)
NEUTROS PCT: 58 %
NRBC: 858 /100{WBCs} — AB
OTHER: 0 %
PLATELETS: 91 10*3/uL — AB (ref 150–440)
PROMYELOCYTES ABS: 0 %
RBC: 3.01 MIL/uL — ABNORMAL LOW (ref 4.40–5.90)
RDW: 22.4 % — ABNORMAL HIGH (ref 11.5–14.5)
WBC: 1.1 10*3/uL — CL (ref 3.8–10.6)

## 2017-01-02 LAB — CBC
HEMATOCRIT: 28.5 % — AB (ref 40.0–52.0)
Hemoglobin: 9.8 g/dL — ABNORMAL LOW (ref 13.0–18.0)
MCH: 32.3 pg (ref 26.0–34.0)
MCHC: 34.3 g/dL (ref 32.0–36.0)
MCV: 94.2 fL (ref 80.0–100.0)
PLATELETS: 107 10*3/uL — AB (ref 150–440)
RBC: 3.03 MIL/uL — AB (ref 4.40–5.90)
RDW: 22 % — ABNORMAL HIGH (ref 11.5–14.5)
WBC: 3.1 10*3/uL — AB (ref 3.8–10.6)

## 2017-01-02 LAB — CULTURE, BLOOD (ROUTINE X 2)

## 2017-01-02 LAB — COMPREHENSIVE METABOLIC PANEL
ALBUMIN: 1.7 g/dL — AB (ref 3.5–5.0)
ALK PHOS: 61 U/L (ref 38–126)
ALT: 158 U/L — AB (ref 17–63)
ANION GAP: 10 (ref 5–15)
AST: 45 U/L — ABNORMAL HIGH (ref 15–41)
BUN: 97 mg/dL — ABNORMAL HIGH (ref 6–20)
CALCIUM: 7.6 mg/dL — AB (ref 8.9–10.3)
CHLORIDE: 106 mmol/L (ref 101–111)
CO2: 21 mmol/L — AB (ref 22–32)
CREATININE: 2.28 mg/dL — AB (ref 0.61–1.24)
GFR calc non Af Amer: 28 mL/min — ABNORMAL LOW (ref >60)
GFR, EST AFRICAN AMERICAN: 32 mL/min — AB (ref >60)
GLUCOSE: 289 mg/dL — AB (ref 65–99)
Potassium: 4.8 mmol/L (ref 3.5–5.1)
Sodium: 137 mmol/L (ref 135–145)
Total Bilirubin: 18.4 mg/dL (ref 0.3–1.2)
Total Protein: 4.9 g/dL — ABNORMAL LOW (ref 6.5–8.1)

## 2017-01-02 LAB — CK: Total CK: 17 U/L — ABNORMAL LOW (ref 49–397)

## 2017-01-02 LAB — LACTIC ACID, PLASMA: LACTIC ACID, VENOUS: 2.1 mmol/L — AB (ref 0.5–1.9)

## 2017-01-02 LAB — PHOSPHORUS: Phosphorus: UNDETERMINED mg/dL (ref 2.5–4.6)

## 2017-01-02 LAB — MAGNESIUM
Magnesium: 2.2 mg/dL (ref 1.7–2.4)
Magnesium: 2.2 mg/dL (ref 1.7–2.4)

## 2017-01-02 LAB — BILIRUBIN, DIRECT: Bilirubin, Direct: <0.1 mg/dL — ABNORMAL LOW (ref 0.1–0.5)

## 2017-01-02 MED ORDER — VITAL AF 1.2 CAL PO LIQD
1000.0000 mL | ORAL | Status: DC
  Administered 2017-01-02 – 2017-01-04 (×4): 1000 mL

## 2017-01-02 MED ORDER — FAMOTIDINE 20 MG PO TABS
20.0000 mg | ORAL_TABLET | Freq: Every day | ORAL | Status: DC
  Administered 2017-01-02 – 2017-01-04 (×3): 20 mg
  Filled 2017-01-02 (×3): qty 1

## 2017-01-02 MED ORDER — INSULIN REGULAR HUMAN 100 UNIT/ML IJ SOLN
INTRAMUSCULAR | Status: DC
  Administered 2017-01-02: 2 [IU]/h via INTRAVENOUS
  Filled 2017-01-02: qty 2.5

## 2017-01-02 MED ORDER — DEXTROSE 5 % IV SOLN
2.0000 g | INTRAVENOUS | Status: DC
  Administered 2017-01-02 – 2017-01-04 (×3): 2 g via INTRAVENOUS
  Filled 2017-01-02 (×5): qty 2

## 2017-01-02 MED ORDER — SENNOSIDES 8.8 MG/5ML PO SYRP
10.0000 mL | ORAL_SOLUTION | Freq: Two times a day (BID) | ORAL | Status: DC | PRN
  Administered 2017-01-03 – 2017-01-04 (×2): 10 mL via ORAL
  Filled 2017-01-02 (×2): qty 10

## 2017-01-02 MED ORDER — VITAL HIGH PROTEIN PO LIQD: 1000.0000 mL | ORAL | Status: DC

## 2017-01-02 MED ORDER — POLYETHYLENE GLYCOL 3350 17 G PO PACK
17.0000 g | PACK | Freq: Every day | ORAL | Status: DC
  Administered 2017-01-02 – 2017-01-04 (×3): 17 g via ORAL
  Filled 2017-01-02 (×3): qty 1

## 2017-01-02 MED ORDER — HEPARIN SODIUM (PORCINE) 1000 UNIT/ML DIALYSIS: 1000.0000 [IU] | INTRAMUSCULAR | Status: DC | PRN

## 2017-01-02 MED ORDER — AMIODARONE IV BOLUS ONLY 150 MG/100ML
150.0000 mg | Freq: Once | INTRAVENOUS | Status: AC
  Administered 2017-01-02: 150 mg via INTRAVENOUS
  Filled 2017-01-02: qty 100

## 2017-01-02 MED ORDER — SODIUM BICARBONATE 8.4 % IV SOLN
INTRAVENOUS | Status: AC
  Administered 2017-01-02 (×2): via INTRAVENOUS
  Filled 2017-01-02 (×5): qty 150

## 2017-01-02 MED ORDER — PUREFLOW DIALYSIS SOLUTION
INTRAVENOUS | Status: DC
  Administered 2017-01-02 – 2017-01-03 (×2): 3 via INTRAVENOUS_CENTRAL
  Administered 2017-01-03: 16:00:00 via INTRAVENOUS_CENTRAL
  Administered 2017-01-03: 3 via INTRAVENOUS_CENTRAL
  Administered 2017-01-03: 11:00:00 via INTRAVENOUS_CENTRAL

## 2017-01-02 NOTE — Progress Notes (Signed)
[facility] Progess Note  Name: Xavier King MRN: [id number] DOB: May 16, 1946    ADMISSION DATE:  12/23/2016  SUBJECTIVE:   Pt currently on the ventilator, can not provide history or review of systems. Over the last 24 hours Had episode of desaturation and tachycardia after receiving a bath. Required brief increase in his FiO2 and subsequently weaned back down. Additional bolus of amiodarone given. Presently his ventricular responses 110-120   Remains critically ill, on multiple vasopressors  VITAL SIGNS: Temp:  [97.5 F (36.4 C)-98.8 F (37.1 C)] 98.4 F (36.9 C) (02/19 0745) Pulse Rate:  [103-138] 131 (02/19 0745) Resp:  [19-24] 19 (02/19 0745) BP: (82-129)/(47-103) 114/89 (02/19 0745) SpO2:  [95 %-99 %] 97 % (02/19 0745) FiO2 (%):  [30 %] 30 % (02/19 0425) Weight:  [210 lb 1.6 oz (95.3 kg)] 210 lb 1.6 oz (95.3 kg) (02/19 0443) HEMODYNAMICS: CVP:  [7 mmHg] 7 mmHg VENTILATOR SETTINGS: Vent Mode: PRVC FiO2 (%):  [30 %] 30 % Set Rate:  [20 bmp] 20 bmp Vt Set:  [500 mL] 500 mL PEEP:  [5 cmH20] 5 cmH20 Plateau Pressure:  [14 cmH20] 14 cmH20 INTAKE / OUTPUT:  Intake/Output Summary (Last 24 hours) at 01/02/17 0757 Last data filed at 01/02/17 0700  Gross per 24 hour  Intake             2851 ml  Output              910 ml  Net             1941 ml    PHYSICAL EXAMINATION: Physical Examination:   VS: BP 114/89 (BP Location: Left Arm)   Pulse (!) 131   Temp 98.4 F (36.9 C) (Core (Comment)) Comment (Src): foley probe  Resp 19   Ht 6\' 1"  (1.854 m)   Wt 210 lb 1.6 oz (95.3 kg)   SpO2 97%   BMI 27.72 kg/m   General Appearance: No distress. Presently on fentanyl and propofol for sedation Neuro: Limited exam secondary to sedative agents HEENT: his pupils appear equal and reactive today. Pulmonary: Coarse rhonchi in pleural rub appreciated bilaterally. CardiovascularNormal S1,S2.  No m/r/g.   Abdomen: Benign, Soft, non-tender. Endocrine: No  evident thyromegaly. Skin:   warm, no rashes, no ecchymosis  Extremities: normal, no cyanosis, clubbing.   LABS: Chest x-ray reveals prominent cardiac silhouette with right infrahilar parenchymal infiltrates.   LABORATORY PANEL:   CBC  Recent Labs Lab 01/02/17 0416  WBC 3.1*  HGB 9.8*  HCT 28.5*  PLT 107*    Chemistries   Recent Labs Lab 12/31/16 0402  01/02/17 0416  NA 133*  < > 137  K 4.5  < > 4.8  CL 102  < > 106  CO2 22  < > 21*  GLUCOSE 317*  < > 289*  BUN 72*  < > 97*  CREATININE 2.23*  < > 2.28*  CALCIUM 7.6*  < > 7.6*  MG 1.9  --   --   PHOS UNABLE TO REPORT DUE TO ICTERUS  --   --   AST 51*  < > 45*  ALT 292*  < > 158*  ALKPHOS  --   < > 61  BILITOT 17.0*  < > 18.4*  < > = values in this interval not displayed.   Recent Labs Lab 01/01/17 1201 01/01/17 1552 01/01/17 1938 01/01/17 2354 01/02/17 0401 01/02/17 0711  GLUCAP 228* 236* 242* 248* 278* 255*    Recent  Labs Lab 01/02/2017 1208 12/29/2016 1830 12/31/16 0500  PHART 7.43 7.32* 7.34*  PCO2ART 31* 35 39  PO2ART 174* 124* 120*    Recent Labs Lab 12/16/2016 1031 12/31/16 0402 01/01/17 0400 01/02/17 0416  AST 106* 51* 40 45*  ALT 497* 292* 211* 158*  ALKPHOS 71  --  60 61  BILITOT 18.9* 17.0* 18.4* 18.4*  ALBUMIN 2.5*  --  1.8* 1.7*    Cardiac Enzymes  Recent Labs Lab 12/31/16 0007  TROPONINI <0.03    RADIOLOGY:  US Renal  Result Date: 12/31/2016 CLINICAL DATA:  Acute renal failure EXAM: RENAL / URINARY TRACT ULTRASOUND COMPLETE COMPARISON:  None. FINDINGS: Right Kidney: Length: 13.6 cm. No hydronephrosis. 4 mm nonobstructing interpolar stone. 12 mm interpolar cortical cyst. Left Kidney: Length: 13.6 cm. 5 mm nonobstructing interpolar stone. No hydronephrosis. No gross mass lesion. Bladder: Decompressed by Foley catheter. Note:  Small volume ascites evident. IMPRESSION: No hydronephrosis. Electronically Signed   By: Misty Stanley M.D.   On: 12/31/2016 17:14   Dg Chest Port 1  View  Result Date: 01/01/2017 CLINICAL DATA:  Acute respiratory failure EXAM: PORTABLE CHEST 1 VIEW COMPARISON:  12/31/2016 FINDINGS: Endotracheal tube with tip measuring 4.5 cm above the carina. Enteric tube tip is off the field of view but below the left hemidiaphragm. Mild cardiac enlargement without vascular congestion. Bilateral basilar infiltrates, greater on the right. Probable small pleural effusions. No pneumothorax. No change since prior study. IMPRESSION: Appliances appear in satisfactory location. Bilateral basilar infiltrates and effusions, greater on the right. No significant change. Electronically Signed   By: Lucienne Capers M.D.   On: 01/01/2017 05:35     ASSESSMENT/PLAN   71 yo white male with severe resp failure from severe septic shock with multiorgan failure from gram neg bacteremia with liver and renal failure on MV  support  1.Respiratory Failure -continue Full MV support -continue Bronchodilator Therapy -Wean Fio2 and PEEP as tolerated  2.Septic shock: Blood cultures positive for gram-negative rods, Enterobacter presently on meropenem and vancomycin. Has received fluid boluses, empiric hydrocortisone. Presently on vasopressin, norepinephrine and Neo-Synephrine. Weaning as tolerated first weaning the norepinephrine secondary to tachycardia rapid atrial fibrillation   3.Atrial fibrillation: Rapid atrial fibrillation with a ventricular response approximately 100-110. Presently on amiodarone infusion, appreciate cardiology's assistance and input, will continue amiodarone  Cardiomyopathy: History of cardiomyopathy with ejection fraction of 30-35%  4.Pancytopenia: Underlying history of mild dysplastic syndrome. We'll hold transfusion of blood until his hemoglobin gets less than 7 and platelets less than 20 or any evidence of active bleeding.  5.Obstructive jaundice: Total bilirubin has decreased to 18.4, no evidence of obstruction on the right upper quadrant ultrasound,  fatty liver noted.  6.Renal failure: Worsening renal function with a BUN of 88 and creatinine of 2.3. Most likely sepsis, ATN. At this time patient does not need hemodialysis. Will obtain renal ultrasound.    NURSING INSTRCUTIONS: Increase fentanyl infusion, wean off propofol, wean off NEO, increase LEVO if needed  Critical Care Time devoted to patient care services described in this note is 45 minutes.   Overall, patient is critically ill, prognosis is guarded.  Patient with Multiorgan failure and at high risk for cardiac arrest and death.    Corrin Parker, M.D.  [facility]  Medical Director [place] Director [facility] Cardio-Pulmonary Department

## 2017-01-02 NOTE — Progress Notes (Signed)
Pharmacy consulted for CRRT medication review. No further adjustments warranted at this time. Pharmacy will continue to monitor daily for renal adjustments.    MLS 01/02/2017 2102

## 2017-01-02 NOTE — Progress Notes (Signed)
Pharmacy Antibiotic Note  Xavier King is a 71 y.o. male admitted on 01/09/2017 with sepsis. Now found to have positive blood cultures showing E.Coli.  Pharmacy has been consulted for ceftriaxone dosing. Patient to be started on CRRT. Marland Kitchen  Plan: Will start patient on ceftriaxone 2g IV Q24hr.   Temp (24hrs), Avg:98.4 F (36.9 C), Min:97.9 F (36.6 C), Max:98.8 F (37.1 C)   Recent Labs Lab 01/10/2017 1031  12/31/16 0007 12/31/16 0402 12/31/16 1726 01/01/17 0400 01/01/17 2323 01/02/17 0416 01/02/17 1829  WBC 1.1*  --   --  2.4*  --  4.3  --  3.1*  --   CREATININE 1.49*  --   --  2.23*  --  2.31*  --  2.28* 2.62*  LATICACIDVEN  --   < > 2.3*  --  2.2* 2.7* 1.9 2.1*  --   < > = values in this interval not displayed.  Estimated Creatinine Clearance: 30.1 mL/min (by C-G formula based on SCr of 2.62 mg/dL (H)).    No Known Allergies   Antibiotics:  Cefepime 2/16 >> 2/16 Meropenem 2/17 >> 2/19 Vancomycin 2/16 >> 2/19 Ceftriaxone 2/19 >>  Microbiology:  2/16 UCx: Ecoli - pansensitive, 50K colonies  2/16 BCx: Ecoli  2/15 MRSA PCR: Positive   Pharmacy will continue to monitor and adjust per consult.   [name] L 01/02/2017 8:59 PM

## 2017-01-02 NOTE — Consult Note (Signed)
[facility] CONSULT NOTE    Date: 01/02/2017                  Patient Name:  Xavier King  MRN: [id number]  DOB: 09/12/1946  Age / Sex: 71 y.o., male         PCP: No PCP Per Patient                 Service Requesting Consult: Pulmonary/Critical Care                 Reason for Consult: Acute renal failure            History of Present Illness: Patient is a 71 y.o. male with a PMHx of Atrial fibrillation, diabetes mellitus type 2, hypertension, myelodysplastic syndrome, congestive heart failure ejection fraction 30-35%, and melanoma, who was admitted to [facility] on 01/08/2017 for evaluation of altered mental status. Patient is visiting from [place]. He was recently hospitalized in [place] and left against medical advise on 12/27/16 to travel to [place] to watch his grandson play baseball. Patient had very poor by mouth intake starting on 12/25/16. When they arrived here he was quite confused. EMS picked up the patient at a motel here in [place]. At that time his temperature was 101 with a heart rate of 130s and blood sugar 302. Patient was also found to be severely hypoxic. He was then placed on mechanical ventilation here.  He currently has Escherichia coli growing in the blood and urine.  We are consulted for evaluation management of acute renal failure. His BUN is now up to 97 with a creatinine of 2.2. Urine output was 900 cc over the preceding 24 hours.   Medications: Outpatient medications: Prescriptions Prior to Admission  Medication Sig Dispense Refill Last Dose  . acetaminophen (TYLENOL) 325 MG tablet Take 650 mg by mouth every 6 (six) hours as needed.   prn at prn  . alum & mag hydroxide-simeth (MAALOX/MYLANTA) 200-200-20 MG/5ML suspension Take 30 mLs by mouth every 6 (six) hours as needed for indigestion or heartburn.   prn at PRN  . apixaban (ELIQUIS) 5 MG TABS tablet Take 5 mg by mouth 2 (two) times daily.   12/29/2016 at am  . aspirin EC 81 MG tablet Take 81  mg by mouth daily.   12/29/2016 at am  . atorvastatin (LIPITOR) 10 MG tablet Take 10 mg by mouth daily.   12/29/2016 at am  . calcium carbonate (TUMS - DOSED IN MG ELEMENTAL CALCIUM) 500 MG chewable tablet Chew 1 tablet by mouth 4 (four) times daily as needed for indigestion or heartburn.   prn at prn  . cyanocobalamin 1000 MCG tablet Take 1,000 mcg by mouth daily.   Past Week at Unknown time  . diltiazem (DILTIAZEM CD) 120 MG 24 hr capsule Take 120 mg by mouth daily.   12/29/2016 at am  . glyBURIDE (DIABETA) 5 MG tablet Take 10 mg by mouth 2 (two) times daily with a meal.   12/29/2016 at am  . guaiFENesin-codeine (VIRTUSSIN A/C) 100-10 MG/5ML syrup Take 10 mLs by mouth 3 (three) times daily as needed for cough.   prn at prn  . insulin aspart (NOVOLOG) 100 UNIT/ML injection Inject into the skin 3 (three) times daily before meals. Sliding scale   Past Week at Unknown time  . Melatonin 3 MG TABS Take 6 mg by mouth at bedtime as needed.   prn at prn  . metoprolol succinate (TOPROL-XL) 100 MG 24  hr tablet Take 100 mg by mouth daily. Take with or immediately following a meal.   12/29/2016 at am  . senna-docusate (SENOKOT-S) 8.6-50 MG tablet Take 1 tablet by mouth 2 (two) times daily as needed for mild constipation.   prn at prn    Current medications: Current Facility-Administered Medications  Medication Dose Route Frequency Provider Last Rate Last Dose  . 0.9 %  sodium chloride infusion  250 mL Intravenous PRN Awilda Bill, NP 10 mL/hr at 01/02/17 0700 250 mL at 01/02/17 0700  . amiodarone (NEXTERONE PREMIX) 360-4.14 MG/200ML-% (1.8 mg/mL) IV infusion  30 mg/hr Intravenous Continuous Awilda Bill, NP 16.7 mL/hr at 01/02/17 1302 30 mg/hr at 01/02/17 1302  . cefTRIAXone (ROCEPHIN) 2 g in dextrose 5 % 50 mL IVPB  2 g Intravenous Q24H Flora Lipps, MD      . chlorhexidine gluconate (MEDLINE KIT) (PERIDEX) 0.12 % solution 15 mL  15 mL Mouth Rinse BID Mikael Spray, NP   15 mL at 01/02/17 0748  .  Chlorhexidine Gluconate Cloth 2 % PADS 6 each  6 each Topical Q0600 John Conforti, DO   6 each at 01/02/17 0430  . famotidine (PEPCID) IVPB 20 mg premix  20 mg Intravenous Q24H Jose Shirl Harris, MD   20 mg at 01/02/17 0150  . feeding supplement (VITAL AF 1.2 CAL) liquid 1,000 mL  1,000 mL Per Tube Continuous Flora Lipps, MD 20 mL/hr at 01/02/17 1159 1,000 mL at 01/02/17 1159  . fentaNYL (SUBLIMAZE) bolus via infusion 25 mcg  25 mcg Intravenous Q1H PRN Awilda Bill, NP   25 mcg at 01/02/17 1304  . fentaNYL (SUBLIMAZE) injection 50 mcg  50 mcg Intravenous Q15 min PRN Awilda Bill, NP   50 mcg at 12/31/16 0219  . fentaNYL (SUBLIMAZE) injection 50 mcg  50 mcg Intravenous Q2H PRN Awilda Bill, NP      . fentaNYL (SUBLIMAZE) injection 50 mcg  50 mcg Intravenous Once Awilda Bill, NP      . fentaNYL 2545mg in NS 2536m(1076mml) infusion-PREMIX  25-400 mcg/hr Intravenous Continuous NeiDarel HongD 40 mL/hr at 01/02/17 1303 400 mcg/hr at 01/02/17 1303  . hydrocortisone sodium succinate (SOLU-CORTEF) 100 MG injection 100 mg  100 mg Intravenous Q8H DanAwilda BillP   100 mg at 01/02/17 1342  . insulin regular (NOVOLIN R,HUMULIN R) 250 Units in sodium chloride 0.9 % 250 mL (1 Units/mL) infusion   Intravenous Continuous KurFlora LippsD 3.7 mL/hr at 01/02/17 1259 3.7 Units/hr at 01/02/17 1259  . MEDLINE mouth rinse  15 mL Mouth Rinse 10 times per day MagMikael SprayP   15 mL at 01/02/17 1346  . mupirocin ointment (BACTROBAN) 2 % 1 application  1 application Nasal BID JohHermelinda DellenO   1 application at 01/03/93/[id number] norepinephrine (LEVOPHED) 16 mg in dextrose 5 % 250 mL (0.064 mg/mL) infusion  0-40 mcg/min Intravenous Continuous MagMikael SprayP 14.1 mL/hr at 01/02/17 1348 15 mcg/min at 01/02/17 1348  . phenylephrine (NEO-SYNEPHRINE) 40 mg in sodium chloride 0.9 % 250 mL (0.16 mg/mL) infusion  0-400 mcg/min Intravenous Titrated MagMikael SprayP   Stopped at 01/02/17 1212   . polyethylene glycol (MIRALAX / GLYCOLAX) packet 17 g  17 g Oral Daily MagMikael SprayP   17 g at 01/02/17 0939  . propofol (DIPRIVAN) 1000 MG/100ML infusion  5-80 mcg/kg/min Intravenous Continuous MagMikael SprayP   Stopped at  01/02/17 9811  . sennosides (SENOKOT) 8.8 MG/5ML syrup 10 mL  10 mL Oral BID PRN Mikael Spray, NP      . sodium bicarbonate 150 mEq in dextrose 5 % 1,000 mL infusion   Intravenous Continuous Flora Lipps, MD 100 mL/hr at 01/02/17 1124    . sodium chloride flush (NS) 0.9 % injection 10-40 mL  10-40 mL Intracatheter Q12H Mikael Spray, NP   40 mL at 01/02/17 1000  . vasopressin (PITRESSIN) 40 Units in sodium chloride 0.9 % 250 mL (0.16 Units/mL) infusion  0.03 Units/min Intravenous Continuous Awilda Bill, NP 11.3 mL/hr at 01/02/17 0700 0.03 Units/min at 01/02/17 0700      Allergies: No Known Allergies    Past Medical History: Past Medical History:  Diagnosis Date  . Cardiomyopathy ([place])    a. 12/2016 Reported echo: EF 30-35%.  . Chronic a-fib ([place])    a. CHA2DS2VASc = 3-->Eliquis.  . Diabetes mellitus without complication ([place])   . Hypertension   . MDS (myelodysplastic syndrome) ([place])   . Melanoma [facility])      Past Surgical History: History reviewed. No pertinent surgical history.   Family History: Unable to obtain from the patient as he's currently intubated and sedated.  Social History: Social History   Social History  . Marital status: Married    Spouse name: N/A  . Number of children: N/A  . Years of education: N/A   Occupational History  . Not on file.   Social History Main Topics  . Smoking status: Current Every Day Smoker    Types: Cigars  . Smokeless tobacco: Never Used     Comment: 1 cigar a day  . Alcohol use No  . Drug use: Unknown  . Sexual activity: Not on file   Other Topics Concern  . Not on file   Social History Narrative  . No narrative on file     Review of Systems: As per HPI  Vital  Signs: Blood pressure 115/90, pulse (!) 124, temperature 98.1 F (36.7 C), resp. rate 20, height '6\' 1"'  (1.854 m), weight 95.3 kg (210 lb 1.6 oz), SpO2 95 %.  Weight trends: Filed Weights   12/31/16 0500 01/01/17 0430 01/02/17 0443  Weight: 93.3 kg (205 lb 11 oz) 93.4 kg (205 lb 14.6 oz) 95.3 kg (210 lb 1.6 oz)    Physical Exam: General: Critically ill appearing   Head: Normocephalic, atraumatic.  Eyes: Icterus noted   Nose: Mucous membranes moist, not inflammed, nonerythematous.  Throat: Endotracheal tube noted to be in place   Neck: Supple, trachea midline.  Lungs:  Bilateral rhonchi noted. Vent assisted.   Heart: Irregular, tachycardic   Abdomen:  BS normoactive. Soft, Nondistended, non-tender.  No masses or organomegaly.  Extremities: 1+ bilateral lower extremity edema noted   Neurologic: Intubated/sedated   Skin: No visible rashes, scars.    Lab results: Basic Metabolic Panel:  Recent Labs Lab 12/31/16 0402 01/01/17 0400 01/02/17 0416 01/02/17 1216  NA 133* 137 137  --   K 4.5 4.6 4.8  --   CL 102 103 106  --   CO2 22 22 21*  --   GLUCOSE 317* 275* 289*  --   BUN 72* 88* 97*  --   CREATININE 2.23* 2.31* 2.28*  --   CALCIUM 7.6* 7.7* 7.6*  --   MG 1.9  --   --  2.2  PHOS UNABLE TO REPORT DUE TO ICTERUS  --   --  UNABLE TO  REPORT DUE TO ICTERIC INTERFERENCE    Liver Function Tests:  Recent Labs Lab 12/25/2016 1031 12/31/16 0402 01/01/17 0400 01/02/17 0416  AST 106* 51* 40 45*  ALT 497* 292* 211* 158*  ALKPHOS 71  --  60 61  BILITOT 18.9* 17.0* 18.4* 18.4*  PROT 5.8*  --  5.1* 4.9*  ALBUMIN 2.5*  --  1.8* 1.7*    Recent Labs Lab 12/19/2016 1031 12/31/16 0402  LIPASE 18 19  AMYLASE  --  7*   No results for input(s): AMMONIA in the last 168 hours.  CBC:  Recent Labs Lab 12/19/2016 1031 12/31/16 0402 01/01/17 0400 01/02/17 0416  WBC 1.1* 2.4* 4.3 3.1*  NEUTROABS 0.7*  --   --   --   HGB 9.9* 8.8* 9.8* 9.8*  HCT 28.2* 26.5* 28.3* 28.5*  MCV  93.8 95.9 92.9 94.2  PLT 91* 72* 88* 107*    Cardiac Enzymes:  Recent Labs Lab 01/03/2017 1031 01/10/2017 1842 12/31/16 0007  TROPONINI <0.03 <0.03 <0.03    BNP: Invalid input(s): POCBNP  CBG:  Recent Labs Lab 01/01/17 2354 01/02/17 0401 01/02/17 0711 01/02/17 1134 01/02/17 1256  GLUCAP 248* 278* 255* 261* 247*    Microbiology: Results for orders placed or performed during the hospital encounter of 01/09/2017  Blood Culture (routine x 2)     Status: Abnormal   Collection Time: 12/24/2016 10:32 AM  Result Value Ref Range Status   Specimen Description BLOOD RIGHT FOREARM  Final   Special Requests BOTTLES DRAWN AEROBIC AND ANAEROBIC ANA9ML AER9ML  Final   Culture  Setup Time   Final    GRAM NEGATIVE RODS IN BOTH AEROBIC AND ANAEROBIC BOTTLES CRITICAL RESULT CALLED TO, READ BACK BY AND VERIFIED WITH: NATE COOKSON AT 0037 12/31/16.PMH Performed at [facility], [place] [address], [place], [place] [address]    Culture ESCHERICHIA COLI (A)  Final   Report Status 01/02/2017 FINAL  Final   Organism ID, Bacteria ESCHERICHIA COLI  Final      Susceptibility   Escherichia coli - MIC*    AMPICILLIN 8 SENSITIVE Sensitive     CEFAZOLIN <=4 SENSITIVE Sensitive     CEFEPIME <=1 SENSITIVE Sensitive     CEFTAZIDIME <=1 SENSITIVE Sensitive     CEFTRIAXONE <=1 SENSITIVE Sensitive     CIPROFLOXACIN <=0.25 SENSITIVE Sensitive     GENTAMICIN <=1 SENSITIVE Sensitive     IMIPENEM <=0.25 SENSITIVE Sensitive     TRIMETH/SULFA <=20 SENSITIVE Sensitive     AMPICILLIN/SULBACTAM 4 SENSITIVE Sensitive     PIP/TAZO <=4 SENSITIVE Sensitive     Extended ESBL NEGATIVE Sensitive     * ESCHERICHIA COLI  Culture, Urine     Status: Abnormal   Collection Time: 12/16/2016 10:32 AM  Result Value Ref Range Status   Specimen Description URINE, RANDOM  Final   Special Requests NONE  Final   Culture 50,000 COLONIES/mL ESCHERICHIA COLI (A)  Final   Report Status 01/01/2017 FINAL  Final   Organism ID,  Bacteria ESCHERICHIA COLI (A)  Final      Susceptibility   Escherichia coli - MIC*    AMPICILLIN <=2 SENSITIVE Sensitive     CEFAZOLIN <=4 SENSITIVE Sensitive     CEFTRIAXONE <=1 SENSITIVE Sensitive     CIPROFLOXACIN <=0.25 SENSITIVE Sensitive     GENTAMICIN <=1 SENSITIVE Sensitive     IMIPENEM <=0.25 SENSITIVE Sensitive     NITROFURANTOIN 32 SENSITIVE Sensitive     TRIMETH/SULFA <=20 SENSITIVE Sensitive  AMPICILLIN/SULBACTAM <=2 SENSITIVE Sensitive     PIP/TAZO <=4 SENSITIVE Sensitive     Extended ESBL NEGATIVE Sensitive     * 50,000 COLONIES/mL ESCHERICHIA COLI  Blood Culture ID Panel (Reflexed)     Status: Abnormal   Collection Time: 12/26/2016 10:32 AM  Result Value Ref Range Status   Enterococcus species NOT DETECTED NOT DETECTED Final   Listeria monocytogenes NOT DETECTED NOT DETECTED Final   Staphylococcus species NOT DETECTED NOT DETECTED Final   Staphylococcus aureus NOT DETECTED NOT DETECTED Final   Streptococcus species NOT DETECTED NOT DETECTED Final   Streptococcus agalactiae NOT DETECTED NOT DETECTED Final   Streptococcus pneumoniae NOT DETECTED NOT DETECTED Final   Streptococcus pyogenes NOT DETECTED NOT DETECTED Final   Acinetobacter baumannii NOT DETECTED NOT DETECTED Final   Enterobacteriaceae species DETECTED (A) NOT DETECTED Final    Comment: Enterobacteriaceae represent a large family of gram-negative bacteria, not a single organism. CRITICAL RESULT CALLED TO, READ BACK BY AND VERIFIED WITH: NATE COOKSON AT 0037 12/31/16.PMH    Enterobacter cloacae complex NOT DETECTED NOT DETECTED Final   Escherichia coli DETECTED (A) NOT DETECTED Final    Comment: CRITICAL RESULT CALLED TO, READ BACK BY AND VERIFIED WITH: NATE COOKSON AT 0037 12/31/16.PMH    Klebsiella oxytoca NOT DETECTED NOT DETECTED Final   Klebsiella pneumoniae NOT DETECTED NOT DETECTED Final   Proteus species NOT DETECTED NOT DETECTED Final   Serratia marcescens NOT DETECTED NOT DETECTED Final    Carbapenem resistance NOT DETECTED NOT DETECTED Final   Haemophilus influenzae NOT DETECTED NOT DETECTED Final   Neisseria meningitidis NOT DETECTED NOT DETECTED Final   Pseudomonas aeruginosa NOT DETECTED NOT DETECTED Final   Candida albicans NOT DETECTED NOT DETECTED Final   Candida glabrata NOT DETECTED NOT DETECTED Final   Candida krusei NOT DETECTED NOT DETECTED Final   Candida parapsilosis NOT DETECTED NOT DETECTED Final   Candida tropicalis NOT DETECTED NOT DETECTED Final  Blood Culture (routine x 2)     Status: Abnormal   Collection Time: 01/11/2017 10:33 AM  Result Value Ref Range Status   Specimen Description BLOOD LEFT ARM  Final   Special Requests   Final    BOTTLES DRAWN AEROBIC AND ANAEROBIC ANA11ML AER13ML   Culture  Setup Time   Final    GRAM NEGATIVE RODS IN BOTH AEROBIC AND ANAEROBIC BOTTLES CRITICAL RESULT CALLED TO, READ BACK BY AND VERIFIED WITH: NATE COOKSON AT 0037 12/31/16.PMH    Culture (A)  Final    ESCHERICHIA COLI SUSCEPTIBILITIES PERFORMED ON PREVIOUS CULTURE WITHIN THE LAST 5 DAYS. Performed at [facility], [place] [address], [place], [place] [address]    Report Status 01/02/2017 FINAL  Final  MRSA PCR Screening     Status: Abnormal   Collection Time: 12/26/2016 10:26 PM  Result Value Ref Range Status   MRSA by PCR POSITIVE (A) NEGATIVE Final    Comment:        The GeneXpert MRSA Assay (FDA approved for NASAL specimens only), is one component of a comprehensive MRSA colonization surveillance program. It is not intended to diagnose MRSA infection nor to guide or monitor treatment for MRSA infections. RESULT CALLED TO, READ BACK BY AND VERIFIED WITH: LESLIE LEWIS AT 0014 12/31/16.PMH     Coagulation Studies: No results for input(s): LABPROT, INR in the last 72 hours.  Urinalysis: No results for input(s): COLORURINE, LABSPEC, PHURINE, GLUCOSEU, HGBUR, BILIRUBINUR, KETONESUR, PROTEINUR, UROBILINOGEN, NITRITE, LEUKOCYTESUR in the last 72  hours.  Invalid  input(s): APPERANCEUR    Imaging: US Renal  Result Date: 12/31/2016 CLINICAL DATA:  Acute renal failure EXAM: RENAL / URINARY TRACT ULTRASOUND COMPLETE COMPARISON:  None. FINDINGS: Right Kidney: Length: 13.6 cm. No hydronephrosis. 4 mm nonobstructing interpolar stone. 12 mm interpolar cortical cyst. Left Kidney: Length: 13.6 cm. 5 mm nonobstructing interpolar stone. No hydronephrosis. No gross mass lesion. Bladder: Decompressed by Foley catheter. Note:  Small volume ascites evident. IMPRESSION: No hydronephrosis. Electronically Signed   By: Misty Stanley M.D.   On: 12/31/2016 17:14   Dg Chest Port 1 View  Result Date: 01/01/2017 CLINICAL DATA:  Acute respiratory failure EXAM: PORTABLE CHEST 1 VIEW COMPARISON:  12/31/2016 FINDINGS: Endotracheal tube with tip measuring 4.5 cm above the carina. Enteric tube tip is off the field of view but below the left hemidiaphragm. Mild cardiac enlargement without vascular congestion. Bilateral basilar infiltrates, greater on the right. Probable small pleural effusions. No pneumothorax. No change since prior study. IMPRESSION: Appliances appear in satisfactory location. Bilateral basilar infiltrates and effusions, greater on the right. No significant change. Electronically Signed   By: Lucienne Capers M.D.   On: 01/01/2017 05:35      Assessment & Plan: Pt is a 71 y.o. male with a PMHx of Atrial fibrillation, diabetes mellitus type 2, hypertension, myelodysplastic syndrome, congestive heart failure ejection fraction 30-35%, and melanoma, who was admitted to [facility] on 12/31/2016 for evaluation of altered mental status.   1.  Acute renal failure due to ATN from septic shock 2.  Septic shock. 3.  Acute respiratory failure. 4.  Anemia unspecified, thrombocytopenia.  Plan:  We are consulted for evaluation management of acute renal failure. The patient appears to be quite ill at this point in time. The underlying issue appears to be septic shock  most likely from urinary source. Escherichia coli was found both in the blood and urine. He appears to have multiorgan failure now. Given underlying septic shock and worsening renal status would recommend trial of continuous renal replacement therapy. The patient's family is currently on the way over to discuss this plan. We plan to discuss risks, benefits, and alternatives to continuous renal placement therapy at this point in time. In addition we recommend checking CK to make sure rhabodmyolysis not playing a role in his renal failure as well.  Otherwise continue ventilatory support and antibiotics as per pulmonary/critical care.

## 2017-01-02 NOTE — Progress Notes (Signed)
Family has decided to proceed with CRRT, pulm/cc to place temporary dialysis catheter, we will proceed with CRRT, no UF planned for now.

## 2017-01-02 NOTE — Progress Notes (Signed)
Inpatient Diabetes Program Recommendations  AACE/ADA: New Consensus Statement on Inpatient Glycemic Control (2015)  Target Ranges:  Prepandial:   less than 140 mg/dL      Peak postprandial:   less than 180 mg/dL (1-2 hours)      Critically ill patients:  140 - 180 mg/dL   Results for Xavier King, Xavier King (MRN [id number]) as of 01/02/2017 09:38  Ref. Range 01/01/2017 07:49 01/01/2017 12:01 01/01/2017 15:52 01/01/2017 19:38 01/01/2017 23:54 01/02/2017 04:01 01/02/2017 07:11  Glucose-Capillary Latest Ref Range: 65 - 99 mg/dL 255 (H) 228 (H) 236 (H) 242 (H) 248 (H) 278 (H) 255 (H)   Review of Glycemic Control  Diabetes history: DM2 Outpatient Diabetes medications: Glyburide 10 mg BID, Novolog TID with meals  Current orders for Inpatient glycemic control: Novolog 0-20 units Q4H  Inpatient Diabetes Program Recommendations:  Insulin - IV drip/GlucoStabilizer: Over the past 24 hours glucose has ranged from 236-278 mg/dl. Please discontinue current Novolog correction and use ICU Glycemic Control order set to order Phase 2 IV insulin.  Thanks, Barnie Alderman, RN, MSN, CDE Diabetes Coordinator Inpatient Diabetes Program [phone] (Team Pager from 8am to 5pm)

## 2017-01-02 NOTE — Procedures (Signed)
Central Venous Dailysis Catheter Placement: Indication: Hemo Dialysis/CRRT   Consent:verbal/written  Risks and benefits explained in detail including risk of infection, bleeding, respiratory failure and death..   Hand washing performed prior to starting the procedure.   Procedure: An active timeout was performed and correct patient, name, & ID confirmed.  After explaining risk and benefits, patient was positioned correctly for central venous access. Patient was prepped using strict sterile technique including chlorohexadine preps, sterile drape, sterile gown and sterile gloves.  The area was prepped, draped and anesthetized in the usual sterile manner. Patient comfort was obtained.  A triple lumen catheter was placed in Left IJ Vein There was good blood return, catheter caps were placed on lumens, catheter flushed easily, the line was secured and a sterile dressing and BIO-PATCH applied.   Ultrasound was used to visualize vasculature and guidance of needle.   Number of Attempts: 1 Complications:none  Estimated Blood Loss: none Chest Radiograph indicated and ordered.  Operator: Gurjot Brisco.   Corrin Parker, M.D.  [facility]  Medical Director [place] Director [facility] Cardio-Pulmonary Department

## 2017-01-02 NOTE — Care Management Note (Addendum)
Case Management Note  Patient Details  Name: Xavier King MRN: [id number] Date of Birth: [birth year]  Subjective/Objective:                  RNCM consult received for assistance with discharge planning. I spoke with patient's wife by phone [phone] to introduce myself and my role at case manager. She states his PCP is Dr. Simona Huh Rupple [phone] and his hematologist is Dr. Salome Arnt [phone] fax [phone] message left for [facility] nurse navigator for fax number. Per wife patient is visiting [place] and plans to return to [facility] when patient is stable. His baseline is independent with daily activities. He is not on O2 as outpatient.   Action/Plan: RNCM will continue to follow. Vicente Males called this RNCM back with fax. He has received two cycles of Vidaza (last tx 12/23/16). He had been hospitalized on 12/25/16 (here on 12/29/2016). Vicente Males could not tell me what hospital he was at. He had cancelled his follow up appointment with Dr. Alroy Dust "due to hospitalization". Per Vicente Males Dr. Alroy Dust would appreciate any notes to assist with his care delivery.  Expected Discharge Date:                  Expected Discharge Plan:     In-House Referral:     Discharge planning Services  CM Consult  Post Acute Care Choice:    Choice offered to:  Spouse  DME Arranged:    DME Agency:     HH Arranged:    Drain Agency:     Status of Service:  In process, will continue to follow  If discussed at Long Length of Stay Meetings, dates discussed:    Additional Comments:  Marshell Garfinkel, RN 01/02/2017, 3:56 PM

## 2017-01-02 NOTE — Progress Notes (Signed)
    Remains in Afib with RVR with heart rate in the 120-140 bpm in the setting of septic shock and anemia. AKI precludes addition of digoxin. Hypotension precludes addition of BB or CCB. On amiodarone gtt for rate control. Consider increasing to full-dose for added rate control. If needed, consider re-bolus over 60 minutes.

## 2017-01-02 NOTE — Progress Notes (Signed)
ET tube pushed in 1 cm to 26 cm at lip mark per Dr.Kasa's order

## 2017-01-02 NOTE — Progress Notes (Signed)
Nutrition Follow-up  DOCUMENTATION CODES:   Not applicable  INTERVENTION:  -Received verbal order from MD Kasa to initiate TF. Recommend starting Vital 1.2 at rate of 20 ml/hr with goal rate of 65 ml/hr providing 1872 kcals, 117 g of protein. Total with diprivan, 2122 kcals, 117 g of protein and 1264 mL of free water. Meets 100% estimated nutritional needs. Continue to assess   NUTRITION DIAGNOSIS:   Inadequate oral intake related to inability to eat as evidenced by NPO status.  Being addressed via TF  GOAL:   Patient will meet greater than or equal to 90% of their needs  MONITOR:   Labs, Weight trends, Vent status, I & O's, TF tolerance  REASON FOR ASSESSMENT:   Ventilator, Malnutrition Screening Tool    ASSESSMENT:   71 yo male who presents via EMS with altered mental status, became confused 2/14, has not eaten since 2/11. PMH of A-fib, myelodysplastic syndrome, DM, Melanoma.  Patient is currently intubated on ventilator support, multiorgan failure with liver and renal dysfunction, remains on 3 pressors but titrating down UOP 910 mL in 24 hours, 250 mL documented thus far today Labs: Creatinine 2.28, BUN 97, elevated LFTs, albumin 1.7, corrected calcium 9.44 Meds: insulin drip, levophed, vasopressin, neosyneprhine, diprivan (250 kcals in 24 hours)  Diet Order:  Diet NPO time specified  Skin:  Reviewed, no issues  Last BM:  PTA  Height:   Ht Readings from Last 1 Encounters:  12/25/2016 6\' 1"  (1.854 m)    Weight:   Wt Readings from Last 1 Encounters:  01/02/17 210 lb 1.6 oz (95.3 kg)    Ideal Body Weight:  83.63 kg  BMI:  Body mass index is 27.72 kg/m.  Estimated Nutritional Needs:   Kcal:  1916 calories  Protein:  112-140 gm  Fluid:  Per Nephrology  EDUCATION NEEDS:   No education needs identified at this time  Kerman Passey [place], [place], [place] [phone] Pager  [phone] Weekend/On-Call Pager

## 2017-01-03 DIAGNOSIS — I482 Chronic atrial fibrillation, unspecified: Secondary | ICD-10-CM

## 2017-01-03 DIAGNOSIS — R17 Unspecified jaundice: Secondary | ICD-10-CM

## 2017-01-03 DIAGNOSIS — R6520 Severe sepsis without septic shock: Secondary | ICD-10-CM

## 2017-01-03 DIAGNOSIS — R7881 Bacteremia: Secondary | ICD-10-CM

## 2017-01-03 DIAGNOSIS — A419 Sepsis, unspecified organism: Secondary | ICD-10-CM

## 2017-01-03 DIAGNOSIS — N179 Acute kidney failure, unspecified: Secondary | ICD-10-CM

## 2017-01-03 LAB — CBC WITH DIFFERENTIAL/PLATELET
BASOS ABS: 0 10*3/uL (ref 0–0.1)
Basophils Relative: 0 %
Eosinophils Absolute: 0.1 10*3/uL (ref 0–0.7)
Eosinophils Relative: 1 %
HCT: 23.6 % — ABNORMAL LOW (ref 40.0–52.0)
Hemoglobin: 8.3 g/dL — ABNORMAL LOW (ref 13.0–18.0)
LYMPHS ABS: 0.2 10*3/uL — AB (ref 1.0–3.6)
Lymphocytes Relative: 3 %
MCH: 31.9 pg (ref 26.0–34.0)
MCHC: 35.3 g/dL (ref 32.0–36.0)
MCV: 90.3 fL (ref 80.0–100.0)
Monocytes Absolute: 0.1 10*3/uL — ABNORMAL LOW (ref 0.2–1.0)
Monocytes Relative: 1 %
NEUTROS ABS: 5 10*3/uL (ref 1.4–6.5)
NEUTROS PCT: 95 %
PLATELETS: 82 10*3/uL — AB (ref 150–440)
RBC: 2.61 MIL/uL — AB (ref 4.40–5.90)
RDW: 21.5 % — AB (ref 11.5–14.5)
WBC: 5.4 10*3/uL (ref 3.8–10.6)
nRBC: 28 /100 WBC — ABNORMAL HIGH

## 2017-01-03 LAB — RENAL FUNCTION PANEL
ALBUMIN: 1.5 g/dL — AB (ref 3.5–5.0)
ALBUMIN: 1.5 g/dL — AB (ref 3.5–5.0)
ALBUMIN: 1.8 g/dL — AB (ref 3.5–5.0)
ANION GAP: 9 (ref 5–15)
ANION GAP: 7 (ref 5–15)
Albumin: 1.6 g/dL — ABNORMAL LOW (ref 3.5–5.0)
Albumin: 1.7 g/dL — ABNORMAL LOW (ref 3.5–5.0)
Anion gap: 8 (ref 5–15)
Anion gap: 8 (ref 5–15)
Anion gap: 7 (ref 5–15)
BUN: 91 mg/dL — ABNORMAL HIGH (ref 6–20)
BUN: 80 mg/dL — ABNORMAL HIGH (ref 6–20)
BUN: 74 mg/dL — ABNORMAL HIGH (ref 6–20)
BUN: 68 mg/dL — AB (ref 6–20)
BUN: 61 mg/dL — ABNORMAL HIGH (ref 6–20)
CALCIUM: 7.7 mg/dL — AB (ref 8.9–10.3)
CALCIUM: 7.6 mg/dL — AB (ref 8.9–10.3)
CHLORIDE: 104 mmol/L (ref 101–111)
CO2: 25 mmol/L (ref 22–32)
CO2: 25 mmol/L (ref 22–32)
CO2: 25 mmol/L (ref 22–32)
CO2: 25 mmol/L (ref 22–32)
CO2: 26 mmol/L (ref 22–32)
CREATININE: 1.65 mg/dL — AB (ref 0.61–1.24)
CREATININE: 1.86 mg/dL — AB (ref 0.61–1.24)
Calcium: 7.5 mg/dL — ABNORMAL LOW (ref 8.9–10.3)
Calcium: 7.7 mg/dL — ABNORMAL LOW (ref 8.9–10.3)
Calcium: 8.2 mg/dL — ABNORMAL LOW (ref 8.9–10.3)
Chloride: 106 mmol/L (ref 101–111)
Chloride: 106 mmol/L (ref 101–111)
Chloride: 103 mmol/L (ref 101–111)
Chloride: 105 mmol/L (ref 101–111)
Creatinine, Ser: 2.26 mg/dL — ABNORMAL HIGH (ref 0.61–1.24)
Creatinine, Ser: 2.07 mg/dL — ABNORMAL HIGH (ref 0.61–1.24)
Creatinine, Ser: 1.66 mg/dL — ABNORMAL HIGH (ref 0.61–1.24)
GFR calc Af Amer: 32 mL/min — ABNORMAL LOW
GFR calc Af Amer: 36 mL/min — ABNORMAL LOW (ref >60)
GFR calc Af Amer: 41 mL/min — ABNORMAL LOW (ref >60)
GFR calc Af Amer: 47 mL/min — ABNORMAL LOW (ref >60)
GFR calc non Af Amer: 28 mL/min — ABNORMAL LOW
GFR calc non Af Amer: 31 mL/min — ABNORMAL LOW (ref >60)
GFR calc non Af Amer: 41 mL/min — ABNORMAL LOW (ref >60)
GFR calc non Af Amer: 35 mL/min — ABNORMAL LOW (ref >60)
GFR calc non Af Amer: 40 mL/min — ABNORMAL LOW (ref >60)
GFR, EST AFRICAN AMERICAN: 47 mL/min — AB (ref >60)
GLUCOSE: 162 mg/dL — AB (ref 65–99)
Glucose, Bld: 148 mg/dL — ABNORMAL HIGH (ref 65–99)
Glucose, Bld: 109 mg/dL — ABNORMAL HIGH (ref 65–99)
Glucose, Bld: 143 mg/dL — ABNORMAL HIGH (ref 65–99)
Glucose, Bld: 157 mg/dL — ABNORMAL HIGH (ref 65–99)
PHOSPHORUS: UNDETERMINED mg/dL (ref 2.5–4.6)
PHOSPHORUS: UNDETERMINED mg/dL (ref 2.5–4.6)
POTASSIUM: 4.4 mmol/L (ref 3.5–5.1)
Phosphorus: UNDETERMINED mg/dL (ref 2.5–4.6)
Phosphorus: UNDETERMINED mg/dL (ref 2.5–4.6)
Phosphorus: UNDETERMINED mg/dL (ref 2.5–4.6)
Potassium: 3.9 mmol/L (ref 3.5–5.1)
Potassium: 4 mmol/L (ref 3.5–5.1)
Potassium: 4.2 mmol/L (ref 3.5–5.1)
Potassium: 4.2 mmol/L (ref 3.5–5.1)
SODIUM: 140 mmol/L (ref 135–145)
Sodium: 139 mmol/L (ref 135–145)
Sodium: 136 mmol/L (ref 135–145)
Sodium: 136 mmol/L (ref 135–145)
Sodium: 138 mmol/L (ref 135–145)

## 2017-01-03 LAB — BLOOD GAS, ARTERIAL
ACID-BASE EXCESS: 0.7 mmol/L (ref 0.0–2.0)
Acid-base deficit: 7.4 mmol/L — ABNORMAL HIGH (ref 0.0–2.0)
BICARBONATE: 25.4 mmol/L (ref 20.0–28.0)
Bicarbonate: 18 mmol/L — ABNORMAL LOW (ref 20.0–28.0)
FIO2: 30
FIO2: 30
MECHVT: 500 mL
MECHVT: 500 mL
O2 Saturation: 98.5 %
O2 Saturation: 95.4 %
PATIENT TEMPERATURE: 37
PCO2 ART: 40 mmHg (ref 32.0–48.0)
PEEP/CPAP: 5 cmH2O
PEEP: 5 cmH2O
PH ART: 7.41 (ref 7.350–7.450)
PO2 ART: 77 mmHg — AB (ref 83.0–108.0)
Patient temperature: 37
RATE: 20 {breaths}/min
RATE: 20 resp/min
pCO2 arterial: 35 mmHg (ref 32.0–48.0)
pH, Arterial: 7.32 — ABNORMAL LOW (ref 7.350–7.450)
pO2, Arterial: 124 mmHg — ABNORMAL HIGH (ref 83.0–108.0)

## 2017-01-03 LAB — PROTIME-INR
INR: 1.47
Prothrombin Time: 18 seconds — ABNORMAL HIGH (ref 11.4–15.2)

## 2017-01-03 LAB — GLUCOSE, CAPILLARY
GLUCOSE-CAPILLARY: 100 mg/dL — AB (ref 65–99)
GLUCOSE-CAPILLARY: 106 mg/dL — AB (ref 65–99)
GLUCOSE-CAPILLARY: 116 mg/dL — AB (ref 65–99)
GLUCOSE-CAPILLARY: 123 mg/dL — AB (ref 65–99)
GLUCOSE-CAPILLARY: 129 mg/dL — AB (ref 65–99)
GLUCOSE-CAPILLARY: 139 mg/dL — AB (ref 65–99)
GLUCOSE-CAPILLARY: 151 mg/dL — AB (ref 65–99)
GLUCOSE-CAPILLARY: 156 mg/dL — AB (ref 65–99)
Glucose-Capillary: 109 mg/dL — ABNORMAL HIGH (ref 65–99)
Glucose-Capillary: 121 mg/dL — ABNORMAL HIGH (ref 65–99)
Glucose-Capillary: 122 mg/dL — ABNORMAL HIGH (ref 65–99)
Glucose-Capillary: 131 mg/dL — ABNORMAL HIGH (ref 65–99)
Glucose-Capillary: 154 mg/dL — ABNORMAL HIGH (ref 65–99)
Glucose-Capillary: 159 mg/dL — ABNORMAL HIGH (ref 65–99)
Glucose-Capillary: 93 mg/dL (ref 65–99)

## 2017-01-03 LAB — AST: AST: 68 U/L — AB (ref 15–41)

## 2017-01-03 LAB — MAGNESIUM
MAGNESIUM: 1.8 mg/dL (ref 1.7–2.4)
Magnesium: 1.9 mg/dL (ref 1.7–2.4)

## 2017-01-03 LAB — PHOSPHORUS
Phosphorus: UNDETERMINED mg/dL (ref 2.5–4.6)
Phosphorus: UNDETERMINED mg/dL (ref 2.5–4.6)

## 2017-01-03 MED ORDER — FENTANYL BOLUS VIA INFUSION
50.0000 ug | INTRAVENOUS | Status: DC | PRN
  Administered 2017-01-03 – 2017-01-05 (×6): 50 ug via INTRAVENOUS
  Filled 2017-01-03: qty 50

## 2017-01-03 MED ORDER — DEXTROSE 10 % IV SOLN: INTRAVENOUS | Status: DC | PRN

## 2017-01-03 MED ORDER — INSULIN GLARGINE 100 UNIT/ML ~~LOC~~ SOLN
10.0000 [IU] | Freq: Every day | SUBCUTANEOUS | Status: DC
  Administered 2017-01-03 – 2017-01-04 (×2): 10 [IU] via SUBCUTANEOUS
  Filled 2017-01-03 (×3): qty 0.1

## 2017-01-03 MED ORDER — DIGOXIN 0.25 MG/ML IJ SOLN
0.2500 mg | Freq: Once | INTRAMUSCULAR | Status: AC
  Administered 2017-01-03: 0.25 mg via INTRAVENOUS
  Filled 2017-01-03: qty 2

## 2017-01-03 MED ORDER — INSULIN ASPART 100 UNIT/ML ~~LOC~~ SOLN
2.0000 [IU] | SUBCUTANEOUS | Status: DC
  Administered 2017-01-03 – 2017-01-04 (×5): 4 [IU] via SUBCUTANEOUS
  Administered 2017-01-04: 6 [IU] via SUBCUTANEOUS
  Administered 2017-01-04 (×2): 4 [IU] via SUBCUTANEOUS
  Filled 2017-01-03 (×3): qty 4
  Filled 2017-01-03: qty 6
  Filled 2017-01-03 (×4): qty 4

## 2017-01-03 MED ORDER — MIDAZOLAM HCL 2 MG/2ML IJ SOLN
2.0000 mg | INTRAMUSCULAR | Status: DC | PRN
  Administered 2017-01-03 – 2017-01-04 (×5): 2 mg via INTRAVENOUS
  Filled 2017-01-03: qty 4
  Filled 2017-01-03: qty 2
  Filled 2017-01-03: qty 4
  Filled 2017-01-03 (×2): qty 2

## 2017-01-03 NOTE — Plan of Care (Signed)
Problem: Education: Goal: Knowledge of [facility] Education information/materials will improve Outcome: Not Progressing No wake up assessment today per Dr. Mortimer Fries. Patient unable to follow commands but will open eyes spontaneously or to voice. When interacting with RN, often will get agitated, calmed either when left alone or through verbal consoling or medication (see eMAR).  Problem: Physical Regulation: Goal: Ability to maintain clinical measurements within normal limits will improve Outcome: Progressing Levophed titrated during shift to maintain MAP goal of 65. Patient transitioned off insulin infusion per protocol. CBGs sustaining in 150s. Goal: Will remain free from infection Outcome: Progressing Continued on Bair Hugger, temperature improved during shift.  Problem: Fluid Volume: Goal: Ability to maintain a balanced intake and output will improve Outcome: Progressing Started ultrafiltration (UF) today at 100 mL/hr per MD order. Patient's urine output decreased and then ceased once UF started. Patient still with edema in all extremities and scrotum.  Problem: Nutrition: Goal: Adequate nutrition will be maintained Outcome: Progressing Tube feedings titrated to goal per order.  Problem: Bowel/Gastric: Goal: Will not experience complications related to bowel motility No BM during shift or since admission. Patient receiving scheduled miralax and RN gave prn senna.

## 2017-01-03 NOTE — Progress Notes (Signed)
[facility] Progess Note  Name: Xavier King MRN: [id number] DOB: 10-21-1946    ADMISSION DATE:  12/31/2016  SUBJECTIVE:  Pt currently on the ventilator, can not provide history or review of systems. Over the last 24 hours Had episode of desaturation and tachycardia after receiving a bath. Required brief increase in his FiO2 and subsequently weaned back down. Additional bolus of amiodarone given. Presently his ventricular responses 110-120   Remains critically ill, on multiple vasopressors   VITAL SIGNS: Temp:  [95.7 F (35.4 C)-98.4 F (36.9 C)] 97 F (36.1 C) (02/20 0700) Pulse Rate:  [103-166] 111 (02/20 0700) Resp:  [16-28] 20 (02/20 0700) BP: (73-139)/(58-102) 93/76 (02/20 0700) SpO2:  [92 %-99 %] 99 % (02/20 0700) FiO2 (%):  [30 %] 30 % (02/20 0410) Weight:  [215 lb 6.2 oz (97.7 kg)] 215 lb 6.2 oz (97.7 kg) (02/20 0449) HEMODYNAMICS: CVP:  [12 mmHg-41 mmHg] 12 mmHg VENTILATOR SETTINGS: Vent Mode: PRVC FiO2 (%):  [30 %] 30 % Set Rate:  [20 bmp] 20 bmp Vt Set:  [500 mL] 500 mL PEEP:  [5 cmH20] 5 cmH20 INTAKE / OUTPUT:  Intake/Output Summary (Last 24 hours) at 01/03/17 0801 Last data filed at 01/03/17 0700  Gross per 24 hour  Intake          4728.76 ml  Output              333 ml  Net          4395.76 ml    Physical Examination:   VS: BP 93/76   Pulse (!) 111   Temp 97 F (36.1 C) (Core (Comment)) Comment (Src): foley  Resp 20   Ht 6\' 1"  (1.854 m)   Wt 215 lb 6.2 oz (97.7 kg)   SpO2 99%   BMI 28.42 kg/m   General Appearance: No distress. Presently on fentanyl and propofol for sedation Neuro: Limited exam secondary to sedative agents HEENT: his pupils appear equal and reactive today. Pulmonary: Coarse rhonchi in pleural rub appreciated bilaterally. CardiovascularNormal S1,S2.  No m/r/g.   Abdomen: Benign, Soft, non-tender. Endocrine: No evident thyromegaly. Skin:   warm, no rashes, no ecchymosis  Extremities: normal, no  cyanosis, clubbing.   LABS: Chest x-ray reveals prominent cardiac silhouette with right infrahilar parenchymal infiltrates.   LABORATORY PANEL:   CBC  Recent Labs Lab 01/02/17 0416  WBC 3.1*  HGB 9.8*  HCT 28.5*  PLT 107*    Chemistries   Recent Labs Lab 01/02/17 0416  01/02/17 1829  01/03/17 0542  NA 137  --  137  < > 140  K 4.8  --  4.4  < > 4.0  CL 106  --  106  < > 106  CO2 21*  --  21*  < > 25  GLUCOSE 289*  --  236*  < > 109*  BUN 97*  --  104*  < > 80*  CREATININE 2.28*  --  2.62*  < > 2.07*  CALCIUM 7.6*  --  7.7*  < > 7.7*  MG  --   < > 2.2  --   --   PHOS  --   < > NOT VALID  < > UNABLE TO REPORT DUE TO ICTERUS  AST 45*  --   --   --   --   ALT 158*  --   --   --   --   ALKPHOS 61  --   --   --   --  BILITOT 18.4*  --   --   --   --   < > = values in this interval not displayed.   Recent Labs Lab 01/03/17 0236 01/03/17 0340 01/03/17 0443 01/03/17 0545 01/03/17 0650 01/03/17 0746  GLUCAP 123* 106* 100* 109* 93 116*    Recent Labs Lab 12/25/2016 1830 12/31/16 0500 01/02/17 0835  PHART 7.32* 7.34* 7.29*  PCO2ART 35 39 40  PO2ART 124* 120* 63*    Recent Labs Lab 12/20/2016 1031 12/31/16 0402 01/01/17 0400 01/02/17 0416 01/02/17 1829 01/03/17 0144 01/03/17 0542  AST 106* 51* 40 45*  --   --   --   ALT 497* 292* 211* 158*  --   --   --   ALKPHOS 71  --  60 61  --   --   --   BILITOT 18.9* 17.0* 18.4* 18.4*  --   --   --   ALBUMIN 2.5*  --  1.8* 1.7* 1.8* 1.6* 1.5*    Cardiac Enzymes  Recent Labs Lab 12/31/16 0007  TROPONINI <0.03      ASSESSMENT/PLAN   71 yo white male with severe resp failure from severe septic shock with multiorgan failure from gram neg bacteremia with e.coli with liver and renal failure on MV  support  1.Respiratory Failure -continue Full MV support -continue Bronchodilator Therapy -Wean Fio2 and PEEP as tolerated  2.Septic shock: Blood cultures positive for gram-negative rods-E.coli -empiric  hydrocortisone.  -currently on vasopressin, norepinephrine. -Weaning as tolerated first weaning the norepinephrine  -will repeat blood cultures   3.Atrial fibrillation: Rapid atrial fibrillation with a ventricular response approximately 100-110. Presently on amiodarone infusion, appreciate cardiology's assistance and input, will continue amiodarone rate increased last night aftre re-bolus of amiodarone  HR remains in 130's  Cardiomyopathy: History of cardiomyopathy with ejection fraction of 30-35%  4.Pancytopenia: Underlying history of mild dysplastic syndrome. We'll hold transfusion of blood until his hemoglobin gets less than 7 and platelets less than 20 or any evidence of active bleeding.  5.Obstructive jaundice: Total bilirubin has decreased to 18.4, no evidence of obstruction on the right upper quadrant ultrasound, fatty liver noted.  6.severe Renal failure:  Patient started on CRRT yesterday   Critical Care Time devoted to patient care services described in this note is 34 minutes.   Overall, patient is critically ill, prognosis is guarded.  Patient with Multiorgan failure and at high risk for cardiac arrest and death.  Family updated yesterday, will update again today  Xavier King Xavier King, M.D.  [facility]  Medical Director [place] Director [facility] Cardio-Pulmonary Department

## 2017-01-03 NOTE — Progress Notes (Signed)
Xavier Bellows MD [address], [place], [place] [address] Phone: [phone] Fax : [phone]  Xavier King is being followed for abnormal LFT's  Day 3 of follow up   Subjective: Intubated- unable to communicate.    Objective: Vital signs in last 24 hours: Vitals:   01/03/17 0900 01/03/17 0920 01/03/17 0930 01/03/17 1000  BP:  (!) 82/63 (!) 81/59 (!) 83/68  Pulse: (!) 144 (!) 133 (!) 113 (!) 133  Resp: '18 20 19 20  ' Temp: 97 F (36.1 C) 97 F (36.1 C) 97 F (36.1 C) 97.2 F (36.2 C)  TempSrc: Core (Comment) Core (Comment) Core (Comment) Core (Comment)  SpO2: 93% 95% 94% 93%  Weight:      Height:       Weight change: 5 lb 4.7 oz (2.4 kg)  Intake/Output Summary (Last 24 hours) at 01/03/17 1159 Last data filed at 01/03/17 1100  Gross per 24 hour  Intake          5886.15 ml  Output               94 ml  Net          5792.15 ml     Exam: Appears jaundiced  Heart::  S1S2 present, rapid rate Lungs: normal, clear to auscultation and clear to auscultation and percussion Abdomen: soft, nontender, normal bowel sounds   Lab Results: Hepatic Function Latest Ref Rng & Units 01/03/2017 01/03/2017 01/03/2017  Total Protein 6.5 - 8.1 g/dL - - -  Albumin 3.5 - 5.0 g/dL 1.7(L) 1.5(L) 1.5(L)  AST 15 - 41 U/L - - -  ALT 17 - 63 U/L - - -  Alk Phosphatase 38 - 126 U/L - - -  Total Bilirubin 0.3 - 1.2 mg/dL - - -  Bilirubin, Direct 0.1 - 0.5 mg/dL - - -    Micro Results: Recent Results (from the past 240 hour(s))  Blood Culture (routine x 2)     Status: Abnormal   Collection Time: 12/16/2016 10:32 AM  Result Value Ref Range Status   Specimen Description BLOOD RIGHT FOREARM  Final   Special Requests BOTTLES DRAWN AEROBIC AND ANAEROBIC ANA9ML AER9ML  Final   Culture  Setup Time   Final    GRAM NEGATIVE RODS IN BOTH AEROBIC AND ANAEROBIC BOTTLES CRITICAL RESULT CALLED TO, READ BACK BY AND VERIFIED WITH: NATE COOKSON AT 0037 12/31/16.PMH Performed at [facility], [place] [address] [place], [place] [address]    Culture ESCHERICHIA COLI (A)  Final   Report Status 01/02/2017 FINAL  Final   Organism ID, Bacteria ESCHERICHIA COLI  Final      Susceptibility   Escherichia coli - MIC*    AMPICILLIN 8 SENSITIVE Sensitive     CEFAZOLIN <=4 SENSITIVE Sensitive     CEFEPIME <=1 SENSITIVE Sensitive     CEFTAZIDIME <=1 SENSITIVE Sensitive     CEFTRIAXONE <=1 SENSITIVE Sensitive     CIPROFLOXACIN <=0.25 SENSITIVE Sensitive     GENTAMICIN <=1 SENSITIVE Sensitive     IMIPENEM <=0.25 SENSITIVE Sensitive     TRIMETH/SULFA <=20 SENSITIVE Sensitive     AMPICILLIN/SULBACTAM 4 SENSITIVE Sensitive     PIP/TAZO <=4 SENSITIVE Sensitive     Extended ESBL NEGATIVE Sensitive     * ESCHERICHIA COLI  Culture, Urine     Status: Abnormal   Collection Time: 01/09/2017 10:32 AM  Result Value Ref Range Status   Specimen Description URINE, RANDOM  Final   Special Requests NONE  Final   Culture 50,000 COLONIES/mL ESCHERICHIA COLI (A)  Final   Report Status 01/01/2017 FINAL  Final   Organism ID, Bacteria ESCHERICHIA COLI (A)  Final      Susceptibility   Escherichia coli - MIC*    AMPICILLIN <=2 SENSITIVE Sensitive     CEFAZOLIN <=4 SENSITIVE Sensitive     CEFTRIAXONE <=1 SENSITIVE Sensitive     CIPROFLOXACIN <=0.25 SENSITIVE Sensitive     GENTAMICIN <=1 SENSITIVE Sensitive     IMIPENEM <=0.25 SENSITIVE Sensitive     NITROFURANTOIN 32 SENSITIVE Sensitive     TRIMETH/SULFA <=20 SENSITIVE Sensitive     AMPICILLIN/SULBACTAM <=2 SENSITIVE Sensitive     PIP/TAZO <=4 SENSITIVE Sensitive     Extended ESBL NEGATIVE Sensitive     * 50,000 COLONIES/mL ESCHERICHIA COLI  Blood Culture ID Panel (Reflexed)     Status: Abnormal   Collection Time: 01/03/2017 10:32 AM  Result Value Ref Range Status   Enterococcus species NOT DETECTED NOT DETECTED Final   Listeria monocytogenes NOT DETECTED NOT DETECTED Final   Staphylococcus species NOT DETECTED NOT DETECTED Final   Staphylococcus aureus  NOT DETECTED NOT DETECTED Final   Streptococcus species NOT DETECTED NOT DETECTED Final   Streptococcus agalactiae NOT DETECTED NOT DETECTED Final   Streptococcus pneumoniae NOT DETECTED NOT DETECTED Final   Streptococcus pyogenes NOT DETECTED NOT DETECTED Final   Acinetobacter baumannii NOT DETECTED NOT DETECTED Final   Enterobacteriaceae species DETECTED (A) NOT DETECTED Final    Comment: Enterobacteriaceae represent a large family of gram-negative bacteria, not a single organism. CRITICAL RESULT CALLED TO, READ BACK BY AND VERIFIED WITH: NATE COOKSON AT 0037 12/31/16.PMH    Enterobacter cloacae complex NOT DETECTED NOT DETECTED Final   Escherichia coli DETECTED (A) NOT DETECTED Final    Comment: CRITICAL RESULT CALLED TO, READ BACK BY AND VERIFIED WITH: NATE COOKSON AT 0037 12/31/16.PMH    Klebsiella oxytoca NOT DETECTED NOT DETECTED Final   Klebsiella pneumoniae NOT DETECTED NOT DETECTED Final   Proteus species NOT DETECTED NOT DETECTED Final   Serratia marcescens NOT DETECTED NOT DETECTED Final   Carbapenem resistance NOT DETECTED NOT DETECTED Final   Haemophilus influenzae NOT DETECTED NOT DETECTED Final   Neisseria meningitidis NOT DETECTED NOT DETECTED Final   Pseudomonas aeruginosa NOT DETECTED NOT DETECTED Final   Candida albicans NOT DETECTED NOT DETECTED Final   Candida glabrata NOT DETECTED NOT DETECTED Final   Candida krusei NOT DETECTED NOT DETECTED Final   Candida parapsilosis NOT DETECTED NOT DETECTED Final   Candida tropicalis NOT DETECTED NOT DETECTED Final  Blood Culture (routine x 2)     Status: Abnormal   Collection Time: 01/07/2017 10:33 AM  Result Value Ref Range Status   Specimen Description BLOOD LEFT ARM  Final   Special Requests   Final    BOTTLES DRAWN AEROBIC AND ANAEROBIC ANA11ML AER13ML   Culture  Setup Time   Final    GRAM NEGATIVE RODS IN BOTH AEROBIC AND ANAEROBIC BOTTLES CRITICAL RESULT CALLED TO, READ BACK BY AND VERIFIED WITH: NATE COOKSON AT  0037 12/31/16.PMH    Culture (A)  Final    ESCHERICHIA COLI SUSCEPTIBILITIES PERFORMED ON PREVIOUS CULTURE WITHIN THE LAST 5 DAYS. Performed at [facility], [place] [address], [place], [place] [address]    Report Status 01/02/2017 FINAL  Final  MRSA PCR Screening     Status: Abnormal   Collection Time: 12/25/2016 10:26 PM  Result Value Ref Range Status   MRSA by  PCR POSITIVE (A) NEGATIVE Final    Comment:        The GeneXpert MRSA Assay (FDA approved for NASAL specimens only), is one component of a comprehensive MRSA colonization surveillance program. It is not intended to diagnose MRSA infection nor to guide or monitor treatment for MRSA infections. RESULT CALLED TO, READ BACK BY AND VERIFIED WITH: LESLIE LEWIS AT 0014 12/31/16.PMH    Studies/Results: Dg Chest Port 1 View  Result Date: 01/02/2017 CLINICAL DATA:  71 y/o  M; central line placement. EXAM: PORTABLE CHEST 1 VIEW COMPARISON:  01/01/2017 chest radiograph FINDINGS: Endotracheal tube is 3 cm from the carina. Enteric tube tip projects over the mid stomach. Left central venous catheter tip projects over cavoatrial junction. Low lung volumes. Elevated right hemidiaphragm. Bibasilar opacities probably representing effusion and atelectasis. No pneumothorax. Stable cardiomegaly. Aortic atherosclerosis with calcification. IMPRESSION: Left central line tip projects over the cavoatrial junction. Stable bibasilar opacities probably representing effusions and atelectasis. Electronically Signed   By: Kristine Garbe M.D.   On: 01/02/2017 15:49   Medications: I have reviewed the patient's current medications. Scheduled Meds: . cefTRIAXone (ROCEPHIN)  IV  2 g Intravenous Q24H  . chlorhexidine gluconate (MEDLINE KIT)  15 mL Mouth Rinse BID  . Chlorhexidine Gluconate Cloth  6 each Topical Q0600  . famotidine  20 mg Per Tube QHS  . fentaNYL (SUBLIMAZE) injection  50 mcg Intravenous Once  . hydrocortisone sod succinate  (SOLU-CORTEF) inj  100 mg Intravenous Q8H  . insulin aspart  2-6 Units Subcutaneous Q4H  . insulin glargine  10 Units Subcutaneous Daily  . mouth rinse  15 mL Mouth Rinse 10 times per day  . mupirocin ointment  1 application Nasal BID  . polyethylene glycol  17 g Oral Daily  . sodium chloride flush  10-40 mL Intracatheter Q12H   Continuous Infusions: . amiodarone 60 mg/hr (01/03/17 0815)  . dextrose    . feeding supplement (VITAL AF 1.2 CAL) 1,000 mL (01/03/17 0800)  . fentaNYL 400 mcg/hr (01/03/17 0700)  . norepinephrine (LEVOPHED) Adult infusion 6 mcg/min (01/03/17 1103)  . phenylephrine (NEO-SYNEPHRINE) Adult infusion Stopped (01/02/17 1212)  . pureflow 3 each (01/03/17 0318)  . vasopressin (PITRESSIN) infusion - *FOR SHOCK* 0.03 Units/min (01/03/17 0700)   PRN Meds:.sodium chloride, dextrose, fentaNYL, heparin, midazolam, sennosides   Assessment: Active Problems:   Acute respiratory failure (HCC)  Leota Sauers 71 y.o. 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 USG shows no biliary dilation . Direct bilirubin on 12/02/16 was normal , indirect hyperbilirubinemia of 18.4 . Indirect hyperbilirubinemai is associated with hemolysis , congestion from CHF, sepsis which can all be occurring in his case  . I do note that his direct bilirubin was also elevated previously. His INR is down from >2 to 1.47 which suggests improvement in liver function . Presently still on pressors , tachycardic .    Plan: 1. Continue treatment of sepsis , monitor LFT's, INR 2. I will check acute hepatitis B serology as it is treatable if positive.     LOS: 4 days   Xavier King 01/03/2017, 12:00 PM

## 2017-01-03 NOTE — Progress Notes (Signed)
Initial  assessment pt lying in bed opens eyes to speech , does not follow commands pt calm no s/s of discomfort. Pt in afib RVR HR140-160's.pt cont. On pressors and insulin drip.pt CRRT started at 2100 as ordered since pt started on CRRT pt became hypothermic , bair hugger applied. Pt family came to visit this shift and was updated on care and had no outstanding questions. Will cont to monitor.

## 2017-01-03 NOTE — Progress Notes (Signed)
[facility]  ROUNDING NOTE   Subjective:  Patient remains critically ill. She remains on the ventilator at this point in time. Continuous renal placement therapy is progressing well. Potassium acceptable at 4.0.    Objective:  Vital signs in last 24 hours:  Temp:  [95.7 F (35.4 C)-98.4 F (36.9 C)] 97 F (36.1 C) (02/20 0900) Pulse Rate:  [103-166] 144 (02/20 0900) Resp:  [16-28] 18 (02/20 0900) BP: (73-131)/(58-102) 94/63 (02/20 0800) SpO2:  [92 %-99 %] 93 % (02/20 0900) FiO2 (%):  [30 %] 30 % (02/20 0900) Weight:  [97.7 kg (215 lb 6.2 oz)] 97.7 kg (215 lb 6.2 oz) (02/20 0449)  Weight change: 2.4 kg (5 lb 4.7 oz) Filed Weights   01/01/17 0430 01/02/17 0443 01/03/17 0449  Weight: 93.4 kg (205 lb 14.6 oz) 95.3 kg (210 lb 1.6 oz) 97.7 kg (215 lb 6.2 oz)    Intake/Output: I/O last 3 completed shifts: In: 6909 [I.V.:5776.5; NG/GT:732.5; IV Piggyback:400] Out: 105 [Urine:738]   Intake/Output this shift:  Total I/O In: 261.4 [I.V.:201.4; NG/GT:60] Out: 0   Physical Exam: General: Critically ill appearing  Head: Normocephalic, atraumatic. Moist oral mucosal membranes  Eyes: Icterus noted  Neck: Supple, trachea midline  Lungs:  Scattered rhonchi, vent assisted  Heart: S1S2 irregular tachycardic  Abdomen:  Soft, nontender, bowel sounds present  Extremities: 2+ peripheral edema.  Neurologic: Sedated  Skin: No lesions  Access: IJ permcath in place    Basic Metabolic Panel:  Recent Labs Lab 12/31/16 0402 01/01/17 0400 01/02/17 0416 01/02/17 1216 01/02/17 1829 01/03/17 0144 01/03/17 0542  NA 133* 137 137  --  137 139 140  K 4.5 4.6 4.8  --  4.4 3.9 4.0  CL 102 103 106  --  106 106 106  CO2 22 22 21*  --  21* 25 25  GLUCOSE 317* 275* 289*  --  236* 148* 109*  BUN 72* 88* 97*  --  104* 91* 80*  CREATININE 2.23* 2.31* 2.28*  --  2.62* 2.26* 2.07*  CALCIUM 7.6* 7.7* 7.6*  --  7.7* 7.5* 7.7*  MG 1.9  --   --  2.2 2.2  --   --   PHOS UNABLE TO  REPORT DUE TO ICTERUS  --   --  UNABLE TO REPORT DUE TO ICTERIC INTERFERENCE NOT VALID UNABLE TO REPORT DUE TO ICTERUS UNABLE TO REPORT DUE TO ICTERUS    Liver Function Tests:  Recent Labs Lab 01/08/2017 1031 12/31/16 0402 01/01/17 0400 01/02/17 0416 01/02/17 1829 01/03/17 0144 01/03/17 0542  AST 106* 51* 40 45*  --   --   --   ALT 497* 292* 211* 158*  --   --   --   ALKPHOS 71  --  60 61  --   --   --   BILITOT 18.9* 17.0* 18.4* 18.4*  --   --   --   PROT 5.8*  --  5.1* 4.9*  --   --   --   ALBUMIN 2.5*  --  1.8* 1.7* 1.8* 1.6* 1.5*    Recent Labs Lab 12/21/2016 1031 12/31/16 0402  LIPASE 18 19  AMYLASE  --  7*   No results for input(s): AMMONIA in the last 168 hours.  CBC:  Recent Labs Lab 01/09/2017 1031 12/31/16 0402 01/01/17 0400 01/02/17 0416  WBC 1.1* 2.4* 4.3 3.1*  NEUTROABS 0.7*  --   --   --   HGB 9.9* 8.8* 9.8* 9.8*  HCT 28.2*  26.5* 28.3* 28.5*  MCV 93.8 95.9 92.9 94.2  PLT 91* 72* 88* 107*    Cardiac Enzymes:  Recent Labs Lab 12/29/2016 1031 12/17/2016 1842 12/31/16 0007 01/02/17 1829  CKTOTAL  --   --   --  17*  TROPONINI <0.03 <0.03 <0.03  --     BNP: Invalid input(s): POCBNP  CBG:  Recent Labs Lab 01/03/17 0443 01/03/17 0545 01/03/17 0650 01/03/17 0746 01/03/17 0854  GLUCAP 100* 109* 93 116* 129*    Microbiology: Results for orders placed or performed during the hospital encounter of 01/10/2017  Blood Culture (routine x 2)     Status: Abnormal   Collection Time: 01/10/2017 10:32 AM  Result Value Ref Range Status   Specimen Description BLOOD RIGHT FOREARM  Final   Special Requests BOTTLES DRAWN AEROBIC AND ANAEROBIC ANA9ML AER9ML  Final   Culture  Setup Time   Final    GRAM NEGATIVE RODS IN BOTH AEROBIC AND ANAEROBIC BOTTLES CRITICAL RESULT CALLED TO, READ BACK BY AND VERIFIED WITH: NATE COOKSON AT 0037 12/31/16.PMH Performed at [facility], [place] [address] [place], [place] [address]    Culture ESCHERICHIA COLI (A)  Final    Report Status 01/02/2017 FINAL  Final   Organism ID, Bacteria ESCHERICHIA COLI  Final      Susceptibility   Escherichia coli - MIC*    AMPICILLIN 8 SENSITIVE Sensitive     CEFAZOLIN <=4 SENSITIVE Sensitive     CEFEPIME <=1 SENSITIVE Sensitive     CEFTAZIDIME <=1 SENSITIVE Sensitive     CEFTRIAXONE <=1 SENSITIVE Sensitive     CIPROFLOXACIN <=0.25 SENSITIVE Sensitive     GENTAMICIN <=1 SENSITIVE Sensitive     IMIPENEM <=0.25 SENSITIVE Sensitive     TRIMETH/SULFA <=20 SENSITIVE Sensitive     AMPICILLIN/SULBACTAM 4 SENSITIVE Sensitive     PIP/TAZO <=4 SENSITIVE Sensitive     Extended ESBL NEGATIVE Sensitive     * ESCHERICHIA COLI  Culture, Urine     Status: Abnormal   Collection Time: 12/27/2016 10:32 AM  Result Value Ref Range Status   Specimen Description URINE, RANDOM  Final   Special Requests NONE  Final   Culture 50,000 COLONIES/mL ESCHERICHIA COLI (A)  Final   Report Status 01/01/2017 FINAL  Final   Organism ID, Bacteria ESCHERICHIA COLI (A)  Final      Susceptibility   Escherichia coli - MIC*    AMPICILLIN <=2 SENSITIVE Sensitive     CEFAZOLIN <=4 SENSITIVE Sensitive     CEFTRIAXONE <=1 SENSITIVE Sensitive     CIPROFLOXACIN <=0.25 SENSITIVE Sensitive     GENTAMICIN <=1 SENSITIVE Sensitive     IMIPENEM <=0.25 SENSITIVE Sensitive     NITROFURANTOIN 32 SENSITIVE Sensitive     TRIMETH/SULFA <=20 SENSITIVE Sensitive     AMPICILLIN/SULBACTAM <=2 SENSITIVE Sensitive     PIP/TAZO <=4 SENSITIVE Sensitive     Extended ESBL NEGATIVE Sensitive     * 50,000 COLONIES/mL ESCHERICHIA COLI  Blood Culture ID Panel (Reflexed)     Status: Abnormal   Collection Time: 12/29/2016 10:32 AM  Result Value Ref Range Status   Enterococcus species NOT DETECTED NOT DETECTED Final   Listeria monocytogenes NOT DETECTED NOT DETECTED Final   Staphylococcus species NOT DETECTED NOT DETECTED Final   Staphylococcus aureus NOT DETECTED NOT DETECTED Final   Streptococcus species NOT DETECTED NOT DETECTED Final    Streptococcus agalactiae NOT DETECTED NOT DETECTED Final   Streptococcus pneumoniae NOT DETECTED NOT DETECTED Final   Streptococcus  pyogenes NOT DETECTED NOT DETECTED Final   Acinetobacter baumannii NOT DETECTED NOT DETECTED Final   Enterobacteriaceae species DETECTED (A) NOT DETECTED Final    Comment: Enterobacteriaceae represent a large family of gram-negative bacteria, not a single organism. CRITICAL RESULT CALLED TO, READ BACK BY AND VERIFIED WITH: NATE COOKSON AT 0037 12/31/16.PMH    Enterobacter cloacae complex NOT DETECTED NOT DETECTED Final   Escherichia coli DETECTED (A) NOT DETECTED Final    Comment: CRITICAL RESULT CALLED TO, READ BACK BY AND VERIFIED WITH: NATE COOKSON AT 0037 12/31/16.PMH    Klebsiella oxytoca NOT DETECTED NOT DETECTED Final   Klebsiella pneumoniae NOT DETECTED NOT DETECTED Final   Proteus species NOT DETECTED NOT DETECTED Final   Serratia marcescens NOT DETECTED NOT DETECTED Final   Carbapenem resistance NOT DETECTED NOT DETECTED Final   Haemophilus influenzae NOT DETECTED NOT DETECTED Final   Neisseria meningitidis NOT DETECTED NOT DETECTED Final   Pseudomonas aeruginosa NOT DETECTED NOT DETECTED Final   Candida albicans NOT DETECTED NOT DETECTED Final   Candida glabrata NOT DETECTED NOT DETECTED Final   Candida krusei NOT DETECTED NOT DETECTED Final   Candida parapsilosis NOT DETECTED NOT DETECTED Final   Candida tropicalis NOT DETECTED NOT DETECTED Final  Blood Culture (routine x 2)     Status: Abnormal   Collection Time: 01/01/2017 10:33 AM  Result Value Ref Range Status   Specimen Description BLOOD LEFT ARM  Final   Special Requests   Final    BOTTLES DRAWN AEROBIC AND ANAEROBIC ANA11ML AER13ML   Culture  Setup Time   Final    GRAM NEGATIVE RODS IN BOTH AEROBIC AND ANAEROBIC BOTTLES CRITICAL RESULT CALLED TO, READ BACK BY AND VERIFIED WITH: NATE COOKSON AT 0037 12/31/16.PMH    Culture (A)  Final    ESCHERICHIA COLI SUSCEPTIBILITIES PERFORMED  ON PREVIOUS CULTURE WITHIN THE LAST 5 DAYS. Performed at [facility], [place] [address], [place], [place] [address]    Report Status 01/02/2017 FINAL  Final  MRSA PCR Screening     Status: Abnormal   Collection Time: 12/25/2016 10:26 PM  Result Value Ref Range Status   MRSA by PCR POSITIVE (A) NEGATIVE Final    Comment:        The GeneXpert MRSA Assay (FDA approved for NASAL specimens only), is one component of a comprehensive MRSA colonization surveillance program. It is not intended to diagnose MRSA infection nor to guide or monitor treatment for MRSA infections. RESULT CALLED TO, READ BACK BY AND VERIFIED WITH: LESLIE LEWIS AT 0014 12/31/16.PMH     Coagulation Studies: No results for input(s): LABPROT, INR in the last 72 hours.  Urinalysis: No results for input(s): COLORURINE, LABSPEC, PHURINE, GLUCOSEU, HGBUR, BILIRUBINUR, KETONESUR, PROTEINUR, UROBILINOGEN, NITRITE, LEUKOCYTESUR in the last 72 hours.  Invalid input(s): APPERANCEUR    Imaging: Dg Chest Port 1 View  Result Date: 01/02/2017 CLINICAL DATA:  71 y/o  M; central line placement. EXAM: PORTABLE CHEST 1 VIEW COMPARISON:  01/01/2017 chest radiograph FINDINGS: Endotracheal tube is 3 cm from the carina. Enteric tube tip projects over the mid stomach. Left central venous catheter tip projects over cavoatrial junction. Low lung volumes. Elevated right hemidiaphragm. Bibasilar opacities probably representing effusion and atelectasis. No pneumothorax. Stable cardiomegaly. Aortic atherosclerosis with calcification. IMPRESSION: Left central line tip projects over the cavoatrial junction. Stable bibasilar opacities probably representing effusions and atelectasis. Electronically Signed   By: Kristine Garbe M.D.   On: 01/02/2017 15:49     Medications:   .  amiodarone 60 mg/hr (01/03/17 0815)  . feeding supplement (VITAL AF 1.2 CAL) 1,000 mL (01/03/17 0800)  . fentaNYL 400 mcg/hr (01/03/17 0700)  . insulin  (NOVOLIN-R) infusion Stopped (01/03/17 0751)  . norepinephrine (LEVOPHED) Adult infusion 6 mcg/min (01/03/17 0800)  . phenylephrine (NEO-SYNEPHRINE) Adult infusion Stopped (01/02/17 1212)  . propofol (DIPRIVAN) infusion Stopped (01/02/17 0937)  . pureflow 3 each (01/03/17 0318)  .  sodium bicarbonate  infusion 1000 mL Stopped (01/03/17 0845)  . vasopressin (PITRESSIN) infusion - *FOR SHOCK* 0.03 Units/min (01/03/17 0700)   . cefTRIAXone (ROCEPHIN)  IV  2 g Intravenous Q24H  . chlorhexidine gluconate (MEDLINE KIT)  15 mL Mouth Rinse BID  . Chlorhexidine Gluconate Cloth  6 each Topical Q0600  . famotidine  20 mg Per Tube QHS  . fentaNYL (SUBLIMAZE) injection  50 mcg Intravenous Once  . hydrocortisone sod succinate (SOLU-CORTEF) inj  100 mg Intravenous Q8H  . mouth rinse  15 mL Mouth Rinse 10 times per day  . mupirocin ointment  1 application Nasal BID  . polyethylene glycol  17 g Oral Daily  . sodium chloride flush  10-40 mL Intracatheter Q12H   sodium chloride, fentaNYL, fentaNYL (SUBLIMAZE) injection, fentaNYL (SUBLIMAZE) injection, heparin, sennosides  Assessment/ Plan:  71 y.o. male  with a PMHx of Atrial fibrillation, diabetes mellitus type 2, hypertension, myelodysplastic syndrome, congestive heart failure ejection fraction 30-35%, and melanoma, who was admitted to [facility] on 12/17/2016 for evaluation of altered mental status.   1.  Acute renal failure due to ATN from septic shock 2.  Septic shock. 3.  Acute respiratory failure. 4.  Anemia unspecified, thrombocytopenia.  Plan:  The patient remains critically ill at this point in time and remains on the ventilator, continuous renal replacement therapy and multiple pressors. Such we will continue continuous renal replacement therapy at this time. After discussion with pulmonary/critical care we will add ultrafiltration target of 100 cc per hour. We will also discontinue sodium bicarbonate. Continue to monitor electrolytes closely.  Patient continues to have a guarded prognosis.  LOS: [place] 2/20/20189:13 AM

## 2017-01-03 NOTE — Progress Notes (Signed)
Pt cont. To receive CRRT with no issues . Pt does open eyes spontaneously and will follow commands at time . Pt remains on levophed at 35mcg

## 2017-01-03 NOTE — Progress Notes (Signed)
    Remains in Afib with RVR with heart rates in the 120's to 140's. Given IV bolus of amiodarone overnight. Now back on full-dose IV amiodarone. Unfortunately, BP is soft precluding addition of BB or CCB. Renal function likely precludes low-digxoin, will d/w MD. If needed, given his hypotension consider TEE/DCCV.

## 2017-01-03 NOTE — Progress Notes (Signed)
Inpatient Diabetes Program Recommendations  AACE/ADA: New Consensus Statement on Inpatient Glycemic Control (2015)  Target Ranges:  Prepandial:   less than 140 mg/dL      Peak postprandial:   less than 180 mg/dL (1-2 hours)      Critically ill patients:  140 - 180 mg/dL   Results for AFFAN, CALLOW (MRN [id number]) as of 01/03/2017 07:34  Ref. Range 01/02/2017 20:14 01/02/2017 21:19 01/02/2017 22:27 01/02/2017 23:31 01/03/2017 00:29 01/03/2017 01:33 01/03/2017 02:36 01/03/2017 03:40 01/03/2017 04:43 01/03/2017 05:45 01/03/2017 06:50  Glucose-Capillary Latest Ref Range: 65 - 99 mg/dL 199 (H) 161 (H) 146 (H) 143 (H) 131 (H) 122 (H) 123 (H) 106 (H) 100 (H) 109 (H) 93   Review of Glycemic Control  Diabetes history: DM2 Outpatient Diabetes medications: Glyburide 10 mg BID, Novolog TID with meals  Current orders for Inpatient glycemic control: Novolin R insulin drip (Phase 2)  Inpatient Diabetes Program Recommendations: Insulin - IV drip/GlucoStabilizer: Please transition to Phase 3 of ICU Glycemic Control order set when all parameters are met.  NOTE: Per ICU Glycemic Control order set, initiate Phase 3 (Transition ICU Glycemic Control Order Set) when all of the following are met: ?  the insulin infusion rate is < 4 units / hour  ? feeds are at goal rate and stable   ? there are 6 subsequent CBG readings < 180 mg/dL    Thanks, Barnie Alderman, RN, MSN, CDE Diabetes Coordinator Inpatient Diabetes Program [phone] (Team Pager from 8am to 5pm)

## 2017-01-03 NOTE — Progress Notes (Signed)
Progress Note  Patient Name: Xavier King Date of Encounter: 01/03/2017  Primary Cardiologist: New consult - Arida  Subjective   Patient remains intubated and critically ill. Remains in atrial fibrillation with rapid ventricular response. Heart rate spiked overnight while being bathed. He was reloaded with amiodarone and remains on an infusion at this time. Patient began CVVH yesterday.  Inpatient Medications    Scheduled Meds: . cefTRIAXone (ROCEPHIN)  IV  2 g Intravenous Q24H  . chlorhexidine gluconate (MEDLINE KIT)  15 mL Mouth Rinse BID  . Chlorhexidine Gluconate Cloth  6 each Topical Q0600  . famotidine  20 mg Per Tube QHS  . fentaNYL (SUBLIMAZE) injection  50 mcg Intravenous Once  . hydrocortisone sod succinate (SOLU-CORTEF) inj  100 mg Intravenous Q8H  . insulin aspart  2-6 Units Subcutaneous Q4H  . insulin glargine  10 Units Subcutaneous Daily  . mouth rinse  15 mL Mouth Rinse 10 times per day  . mupirocin ointment  1 application Nasal BID  . polyethylene glycol  17 g Oral Daily  . sodium chloride flush  10-40 mL Intracatheter Q12H   Continuous Infusions: . amiodarone 60 mg/hr (01/03/17 0815)  . dextrose    . feeding supplement (VITAL AF 1.2 CAL) 1,000 mL (01/03/17 1313)  . fentaNYL 400 mcg/hr (01/03/17 1530)  . norepinephrine (LEVOPHED) Adult infusion 5 mcg/min (01/03/17 1715)  . phenylephrine (NEO-SYNEPHRINE) Adult infusion Stopped (01/02/17 1212)  . pureflow 2,500 mL/hr at 01/03/17 1625  . vasopressin (PITRESSIN) infusion - *FOR SHOCK* 0.03 Units/min (01/03/17 1644)   PRN Meds: sodium chloride, dextrose, fentaNYL, heparin, midazolam, sennosides   Vital Signs    Vitals:   01/03/17 1630 01/03/17 1645 01/03/17 1700 01/03/17 1715  BP: (!) 84/66 (!) 66/55 (!) 74/62 (!) 83/65  Pulse: (!) 130 (!) 124 (!) 122 (!) 125  Resp: 20 (!) '21 20 19  ' Temp: 97.7 F (36.5 C) 97.7 F (36.5 C) 97.7 F (36.5 C) 97.7 F (36.5 C)  TempSrc: Core (Comment) Core (Comment)  Core (Comment) Core (Comment)  SpO2: 94% 94% 94% 95%  Weight:      Height:        Intake/Output Summary (Last 24 hours) at 01/03/17 1725 Last data filed at 01/03/17 1722  Gross per 24 hour  Intake          5665.75 ml  Output              770 ml  Net          4895.75 ml   Filed Weights   01/01/17 0430 01/02/17 0443 01/03/17 0449  Weight: 205 lb 14.6 oz (93.4 kg) 210 lb 1.6 oz (95.3 kg) 215 lb 6.2 oz (97.7 kg)    Telemetry    Atrial fibrillation with rapid ventricular response and occasional PVCs versus aberrancy. - Personally Reviewed  ECG    01/01/17: Atrial fibrillation with PVC versus apparent C, incomplete right bundle branch block, and inferolateral ST depression and T-wave inversion.  - Personally Reviewed  Physical Exam   GEN: Intubated but appears uncomfortable; significant jaundice evident Neck:  unable to assess due to support devices. Cardiac:  tachycardic and irregularly irregular. No murmurs. Respiratory:  clear anteriorly. GI:  hypoactive bowel sounds. Soft and nontender. MS:  anasarca with 2+ pitting edema of the upper and lower extremities. Neuro:  Opens eyes spontaneously. Psych: Unable to assess, as patient is intubated and sedated.  Labs    Chemistry Recent Labs Lab 12/24/2016 1031 12/31/16 0402 01/01/17 0400 01/02/17  [id number]  01/03/17 0542 01/03/17 1031 01/03/17 1426  NA 132* 133* 137 137  < > 140 136 136  K 4.7 4.5 4.6 4.8  < > 4.0 4.2 4.2  CL 99* 102 103 106  < > 106 103 104  CO2 '22 22 22 ' 21*  < > '25 25 25  ' GLUCOSE 251* 317* 275* 289*  < > 109* 143* 157*  BUN 65* 72* 88* 97*  < > 80* 74* 68*  CREATININE 1.49* 2.23* 2.31* 2.28*  < > 2.07* 1.65* 1.86*  CALCIUM 8.6* 7.6* 7.7* 7.6*  < > 7.7* 7.6* 7.7*  PROT 5.8*  --  5.1* 4.9*  --   --   --   --   ALBUMIN 2.5*  --  1.8* 1.7*  < > 1.5* 1.5* 1.7*  AST 106* 51* 40 45*  --   --   --   --   ALT 497* 292* 211* 158*  --   --   --   --   ALKPHOS 71  --  60 61  --   --   --   --   BILITOT 18.9* 17.0*  18.4* 18.4*  --   --   --   --   GFRNONAA 46* 28* 27* 28*  < > 31* 41* 35*  GFRAA 53* 33* 31* 32*  < > 36* 47* 41*  ANIONGAP '11 9 12 10  ' < > '9 8 7  ' < > = values in this interval not displayed.   Hematology Recent Labs Lab 01/01/17 0400 01/02/17 0416 01/03/17 0915  WBC 4.3 3.1* 5.4  RBC 3.04* 3.03* 2.61*  HGB 9.8* 9.8* 8.3*  HCT 28.3* 28.5* 23.6*  MCV 92.9 94.2 90.3  MCH 32.3 32.3 31.9  MCHC 34.7 34.3 35.3  RDW 22.4* 22.0* 21.5*  PLT 88* 107* 82*    Cardiac Enzymes Recent Labs Lab 01/11/2017 1031 01/08/2017 1842 12/31/16 0007  TROPONINI <0.03 <0.03 <0.03   No results for input(s): TROPIPOC in the last 168 hours.   BNP Recent Labs Lab 12/20/2016 1032  BNP 2,471.0*     DDimer No results for input(s): DDIMER in the last 168 hours.   Radiology    Dg Chest Port 1 View  Result Date: 01/02/2017 CLINICAL DATA:  71 y/o  M; central line placement. EXAM: PORTABLE CHEST 1 VIEW COMPARISON:  01/01/2017 chest radiograph FINDINGS: Endotracheal tube is 3 cm from the carina. Enteric tube tip projects over the mid stomach. Left central venous catheter tip projects over cavoatrial junction. Low lung volumes. Elevated right hemidiaphragm. Bibasilar opacities probably representing effusion and atelectasis. No pneumothorax. Stable cardiomegaly. Aortic atherosclerosis with calcification. IMPRESSION: Left central line tip projects over the cavoatrial junction. Stable bibasilar opacities probably representing effusions and atelectasis. Electronically Signed   By: Kristine Garbe M.D.   On: 01/02/2017 15:49    Cardiac Studies   TTE (12/31/16): Mildly dilated left ventricle with severely reduced contraction (EF 20-25%) with diffuse hypokinesis. Mild MR. Moderately dilated left atrium. Mildly dilated right ventricle with normal contraction. Mildly dilated right ventricle. Mild to moderate tricuspid regurgitation. Moderately elevated pulmonary pressure.  Patient Profile     71 y.o. male  reported history of chronic atrial fibrillation on apixaban, hypertension, diabetes mellitus, cardiomyopathy, myelodysplastic syndrome, and melanoma, who presented to [facility] on 2/16 with 2 day history of confusion and worsening mental status in the setting of fever and atrial fibrillation with rapid ventricular response.  Assessment & Plan    Sepsis/shock: Patient remains  hypotensive on vasopressors, most likely due to septic shock. A concurrent component of cardiogenic shock is likely. He was found to have pan sensitive E. coli bacteremia for which he is currently receiving parenteral antibiotics.  Continue management per critical care.  Wean vasopressors, as tolerated, as chronotropic effects of medications likely exacerbating atrial fibrillation with rapid ventricular response.  No indication for hemodynamic support at this time given patient's multiorgan dysfunction, coagulopathy, and thrombocytopenia.  Atrial fibrillation with rapid ventricular response: Heart rate remains suboptimally controlled and required additional loading of intravenous amiodarone for heart rate control. Patient also received a dose of digoxin this morning. Hypotension precludes utilization of beta blocker or calcium channel blocker.  Recommend continuation of amiodarone. Would avoid further doses of digoxin given renal failure.  Patient is a poor candidate for anticoagulation given his multiorgan failure, coagulopathy, and thrombocytopenia.  If he becomes acutely unstable, emergent cardioversion could be considered. Would use this only as a last resort, given that patient is a poor candidate for anticoagulation and TEE.  Cardiomyopathy: Unclear if this is ischemic or nonischemic. EF was previously reported to be 30-35% but has declined since admission. Patient is massively volume overloaded on exam, though I suspect much of this is third spacing.  Continue CVP monitoring for target CVP of 10 mmHg.  Fluid removal per  critical care and nephrology via CVVH.  Acute hypoxic respiratory failure: Secondary to critical illness from the aforementioned problems.  Continued ventilator support per critical care medicine.  Acute kidney injury: Renal function abnormal with creatinine 1.5 on admission. He has been oliguric and is now on CVVH.  Avoid nephrotoxic drugs. Continue management per nephrology and critical care medicine.  Liver failure: Chronicity uncertain. Patient is quite jaundiced on exam with most recent bilirubin yesterday having trended up to 18.4.  Avoid hepatotoxic agents.  Would like to limit amiodarone use if possible, though there is no other reasonable agent to assist with rate control at this time.  Disposition:  Given multiorgan failure, palliative care consultation would be reasonable.  Signed, Nelva Bush, MD  01/03/2017, 5:25 PM

## 2017-01-03 NOTE — Progress Notes (Signed)
Pharmacy consulted for CRRT medication review. No further adjustments warranted at this time. Pharmacy will continue to monitor daily for renal adjustments.    Ulice Dash, PharmD Clinical Pharmacist

## 2017-01-04 DIAGNOSIS — L8990 Pressure ulcer of unspecified site, unspecified stage: Secondary | ICD-10-CM | POA: Insufficient documentation

## 2017-01-04 DIAGNOSIS — N171 Acute kidney failure with acute cortical necrosis: Secondary | ICD-10-CM

## 2017-01-04 DIAGNOSIS — R7989 Other specified abnormal findings of blood chemistry: Secondary | ICD-10-CM

## 2017-01-04 LAB — CBC WITH DIFFERENTIAL/PLATELET
BLASTS: 0 %
Band Neutrophils: 3 %
Basophils Absolute: 0 10*3/uL (ref 0–0.1)
Basophils Relative: 1 %
EOS PCT: 0 %
Eosinophils Absolute: 0 10*3/uL (ref 0–0.7)
HEMATOCRIT: 21.5 % — AB (ref 40.0–52.0)
Hemoglobin: 7.6 g/dL — ABNORMAL LOW (ref 13.0–18.0)
LYMPHS PCT: 4 %
Lymphs Abs: 0.2 10*3/uL — ABNORMAL LOW (ref 1.0–3.6)
MCH: 32 pg (ref 26.0–34.0)
MCHC: 35.4 g/dL (ref 32.0–36.0)
MCV: 90.6 fL (ref 80.0–100.0)
METAMYELOCYTES PCT: 0 %
MONOS PCT: 0 %
Monocytes Absolute: 0 10*3/uL — ABNORMAL LOW (ref 0.2–1.0)
Myelocytes: 0 %
NEUTROS ABS: 3.8 10*3/uL (ref 1.4–6.5)
Neutrophils Relative %: 92 %
OTHER: 0 %
PLATELETS: 83 10*3/uL — AB (ref 150–440)
Promyelocytes Absolute: 0 %
RBC: 2.37 MIL/uL — AB (ref 4.40–5.90)
RDW: 21.3 % — AB (ref 11.5–14.5)
WBC: 4 10*3/uL (ref 3.8–10.6)
nRBC: 44 /100 WBC — ABNORMAL HIGH

## 2017-01-04 LAB — RENAL FUNCTION PANEL
ALBUMIN: 1.6 g/dL — AB (ref 3.5–5.0)
ANION GAP: 7 (ref 5–15)
Albumin: 1.7 g/dL — ABNORMAL LOW (ref 3.5–5.0)
Albumin: 1.7 g/dL — ABNORMAL LOW (ref 3.5–5.0)
Albumin: 1.7 g/dL — ABNORMAL LOW (ref 3.5–5.0)
Albumin: 1.7 g/dL — ABNORMAL LOW (ref 3.5–5.0)
Albumin: 1.7 g/dL — ABNORMAL LOW (ref 3.5–5.0)
Anion gap: 5 (ref 5–15)
Anion gap: 6 (ref 5–15)
Anion gap: 6 (ref 5–15)
Anion gap: 7 (ref 5–15)
Anion gap: 8 (ref 5–15)
BUN: 50 mg/dL — ABNORMAL HIGH (ref 6–20)
BUN: 52 mg/dL — ABNORMAL HIGH (ref 6–20)
BUN: 53 mg/dL — AB (ref 6–20)
BUN: 57 mg/dL — AB (ref 6–20)
BUN: 58 mg/dL — AB (ref 6–20)
BUN: 58 mg/dL — ABNORMAL HIGH (ref 6–20)
CALCIUM: 7.7 mg/dL — AB (ref 8.9–10.3)
CALCIUM: 8.1 mg/dL — AB (ref 8.9–10.3)
CHLORIDE: 103 mmol/L (ref 101–111)
CHLORIDE: 104 mmol/L (ref 101–111)
CHLORIDE: 104 mmol/L (ref 101–111)
CHLORIDE: 104 mmol/L (ref 101–111)
CO2: 26 mmol/L (ref 22–32)
CO2: 26 mmol/L (ref 22–32)
CO2: 27 mmol/L (ref 22–32)
CO2: 27 mmol/L (ref 22–32)
CO2: 27 mmol/L (ref 22–32)
CO2: 28 mmol/L (ref 22–32)
CREATININE: 0.85 mg/dL (ref 0.61–1.24)
CREATININE: 1.52 mg/dL — AB (ref 0.61–1.24)
CREATININE: 1.55 mg/dL — AB (ref 0.61–1.24)
CREATININE: 1.61 mg/dL — AB (ref 0.61–1.24)
Calcium: 7.8 mg/dL — ABNORMAL LOW (ref 8.9–10.3)
Calcium: 7.9 mg/dL — ABNORMAL LOW (ref 8.9–10.3)
Calcium: 8 mg/dL — ABNORMAL LOW (ref 8.9–10.3)
Calcium: 8 mg/dL — ABNORMAL LOW (ref 8.9–10.3)
Chloride: 106 mmol/L (ref 101–111)
Chloride: 105 mmol/L (ref 101–111)
Creatinine, Ser: 1.73 mg/dL — ABNORMAL HIGH (ref 0.61–1.24)
Creatinine, Ser: 1.54 mg/dL — ABNORMAL HIGH (ref 0.61–1.24)
GFR calc Af Amer: 45 mL/min — ABNORMAL LOW (ref >60)
GFR calc Af Amer: 49 mL/min — ABNORMAL LOW (ref >60)
GFR calc Af Amer: >60 mL/min (ref >60)
GFR calc non Af Amer: 39 mL/min — ABNORMAL LOW (ref >60)
GFR calc non Af Amer: 42 mL/min — ABNORMAL LOW (ref >60)
GFR calc non Af Amer: 44 mL/min — ABNORMAL LOW (ref >60)
GFR calc non Af Amer: 44 mL/min — ABNORMAL LOW (ref >60)
GFR calc non Af Amer: 45 mL/min — ABNORMAL LOW (ref >60)
GFR calc non Af Amer: >60 mL/min (ref >60)
GFR, EST AFRICAN AMERICAN: 52 mL/min — AB (ref >60)
GFR, EST AFRICAN AMERICAN: 51 mL/min — AB (ref >60)
GFR, EST AFRICAN AMERICAN: 51 mL/min — AB (ref >60)
GLUCOSE: 171 mg/dL — AB (ref 65–99)
GLUCOSE: 184 mg/dL — AB (ref 65–99)
GLUCOSE: 212 mg/dL — AB (ref 65–99)
GLUCOSE: 246 mg/dL — AB (ref 65–99)
Glucose, Bld: 183 mg/dL — ABNORMAL HIGH (ref 65–99)
Glucose, Bld: 246 mg/dL — ABNORMAL HIGH (ref 65–99)
PHOSPHORUS: UNDETERMINED mg/dL (ref 2.5–4.6)
PHOSPHORUS: UNDETERMINED mg/dL (ref 2.5–4.6)
POTASSIUM: 4.2 mmol/L (ref 3.5–5.1)
POTASSIUM: 4.3 mmol/L (ref 3.5–5.1)
Phosphorus: UNDETERMINED mg/dL (ref 2.5–4.6)
Phosphorus: UNDETERMINED mg/dL (ref 2.5–4.6)
Phosphorus: UNDETERMINED mg/dL (ref 2.5–4.6)
Phosphorus: UNDETERMINED mg/dL (ref 2.5–4.6)
Potassium: 4.3 mmol/L (ref 3.5–5.1)
Potassium: 5.4 mmol/L — ABNORMAL HIGH (ref 3.5–5.1)
Potassium: 4.2 mmol/L (ref 3.5–5.1)
Potassium: 4 mmol/L (ref 3.5–5.1)
SODIUM: 138 mmol/L (ref 135–145)
SODIUM: 138 mmol/L (ref 135–145)
SODIUM: 138 mmol/L (ref 135–145)
Sodium: 137 mmol/L (ref 135–145)
Sodium: 137 mmol/L (ref 135–145)
Sodium: 138 mmol/L (ref 135–145)

## 2017-01-04 LAB — MAGNESIUM: MAGNESIUM: 1.8 mg/dL (ref 1.7–2.4)

## 2017-01-04 LAB — GLUCOSE, CAPILLARY
GLUCOSE-CAPILLARY: 183 mg/dL — AB (ref 65–99)
Glucose-Capillary: 156 mg/dL — ABNORMAL HIGH (ref 65–99)
Glucose-Capillary: 163 mg/dL — ABNORMAL HIGH (ref 65–99)
Glucose-Capillary: 189 mg/dL — ABNORMAL HIGH (ref 65–99)
Glucose-Capillary: 222 mg/dL — ABNORMAL HIGH (ref 65–99)
Glucose-Capillary: 229 mg/dL — ABNORMAL HIGH (ref 65–99)
Glucose-Capillary: 254 mg/dL — ABNORMAL HIGH (ref 65–99)

## 2017-01-04 LAB — HEPATIC FUNCTION PANEL
ALK PHOS: 70 U/L (ref 38–126)
ALT: 120 U/L — AB (ref 17–63)
AST: 72 U/L — ABNORMAL HIGH (ref 15–41)
Albumin: 1.7 g/dL — ABNORMAL LOW (ref 3.5–5.0)
BILIRUBIN DIRECT: 11.1 mg/dL — AB (ref 0.1–0.5)
BILIRUBIN INDIRECT: 7.7 mg/dL — AB (ref 0.3–0.9)
BILIRUBIN TOTAL: 18.8 mg/dL — AB (ref 0.3–1.2)
TOTAL PROTEIN: 4.7 g/dL — AB (ref 6.5–8.1)

## 2017-01-04 LAB — PROTIME-INR
INR: 1.56
Prothrombin Time: 18.8 seconds — ABNORMAL HIGH (ref 11.4–15.2)

## 2017-01-04 LAB — HEPATITIS A ANTIBODY, IGM: Hep A IgM: NEGATIVE

## 2017-01-04 LAB — HEPATITIS B DNA, ULTRAQUANTITATIVE, PCR
HBV DNA SERPL PCR-ACNC: NOT DETECTED IU/mL
HBV DNA SERPL PCR-LOG IU: UNDETERMINED

## 2017-01-04 LAB — ALT: ALT: 109 U/L — AB (ref 17–63)

## 2017-01-04 LAB — HEPATITIS B SURFACE ANTIGEN: Hepatitis B Surface Ag: NEGATIVE

## 2017-01-04 LAB — AMMONIA: Ammonia: 12 umol/L (ref 9–35)

## 2017-01-04 MED ORDER — DOCUSATE SODIUM 50 MG/5ML PO LIQD
100.0000 mg | Freq: Two times a day (BID) | ORAL | Status: DC
  Administered 2017-01-04 (×2): 100 mg
  Filled 2017-01-04 (×2): qty 10

## 2017-01-04 MED ORDER — SENNOSIDES 8.8 MG/5ML PO SYRP
5.0000 mL | ORAL_SOLUTION | Freq: Two times a day (BID) | ORAL | Status: DC
  Administered 2017-01-04: 5 mL
  Filled 2017-01-04: qty 5

## 2017-01-04 MED ORDER — PUREFLOW DIALYSIS SOLUTION
INTRAVENOUS | Status: DC
  Administered 2017-01-04: 3 via INTRAVENOUS_CENTRAL
  Administered 2017-01-05: 1500 mL via INTRAVENOUS_CENTRAL

## 2017-01-04 MED ORDER — PUREFLOW DIALYSIS SOLUTION
INTRAVENOUS | Status: DC
  Administered 2017-01-04: 1 via INTRAVENOUS_CENTRAL
  Administered 2017-01-04: 3 via INTRAVENOUS_CENTRAL

## 2017-01-04 MED ORDER — VITAL AF 1.2 CAL PO LIQD: 1000.0000 mL | ORAL | Status: DC

## 2017-01-04 MED ORDER — PRO-STAT SUGAR FREE PO LIQD
30.0000 mL | ORAL | Status: DC
  Administered 2017-01-04 – 2017-01-05 (×4): 30 mL via ORAL

## 2017-01-04 MED ORDER — INSULIN GLARGINE 100 UNIT/ML ~~LOC~~ SOLN
10.0000 [IU] | Freq: Every day | SUBCUTANEOUS | Status: DC
  Filled 2017-01-04: qty 0.1

## 2017-01-04 MED ORDER — LORAZEPAM 2 MG/ML IJ SOLN: 4.0000 mg | Freq: Once | INTRAMUSCULAR | Status: DC

## 2017-01-04 MED ORDER — INSULIN ASPART 100 UNIT/ML ~~LOC~~ SOLN
0.0000 [IU] | SUBCUTANEOUS | Status: DC
  Administered 2017-01-04: 5 [IU] via SUBCUTANEOUS
  Administered 2017-01-04: 8 [IU] via SUBCUTANEOUS
  Administered 2017-01-05 (×2): 5 [IU] via SUBCUTANEOUS
  Filled 2017-01-04 (×3): qty 5
  Filled 2017-01-04: qty 8

## 2017-01-04 NOTE — Progress Notes (Signed)
Jonathon Bellows MD [address] [place], [place] [address] Phone: [phone] Fax : [phone]  Xavier King is being followed for abnormal LFT's  Day 4 of follow up   Subjective: Intubated , drowsy unable to communicate    Objective: Vital signs in last 24 hours: Vitals:   01/04/17 0945 01/04/17 1000 01/04/17 1015 01/04/17 1030  BP: 105/82 (!) 88/69 100/79 (!) 82/58  Pulse: (!) 127 (!) 120 (!) 122 99  Resp: _0 Temp: 97 F (36.1 C) 97.2 F (36.2 C) 97.2 F (36.2 C) 97.2 F (36.2 C)  TempSrc:      SpO2: 97% 95% 95% 96%  Weight:      Height:       Weight change: 3 lb 12 oz (1.7 kg)  Intake/Output Summary (Last 24 hours) at 01/04/17 1042 Last data filed at 01/04/17 1000  Gross per 24 hour  Intake          3920.56 ml  Output             2160 ml  Net          1760.56 ml     Exam: Heart:: S1S2 present or without murmur or extra heart sounds Lungs: normal, clear to auscultation and clear to auscultation and percussion Abdomen: soft, nontender, normal bowel sounds  Appears very jaundiced    Lab Results: Hepatic Function Latest Ref Rng & Units 01/04/2017 01/04/2017 01/04/2017  Total Protein 6.5 - 8.1 g/dL 4.7(L) - -  Albumin 3.5 - 5.0 g/dL 1.7(L) 1.7(L) 1.6(L)  AST 15 - 41 U/L 72(H) - -  ALT 17 - 63 U/L 120(H) - 109(H)  Alk Phosphatase 38 - 126 U/L 70 - -  Total Bilirubin 0.3 - 1.2 mg/dL 18.8(HH) - -  Bilirubin, Direct 0.1 - 0.5 mg/dL 11.1(H) - -    Micro Results: Recent Results (from the past 240 hour(s))  Blood Culture (routine x 2)     Status: Abnormal   Collection Time: 12/19/2016 10:32 AM  Result Value Ref Range Status   Specimen Description BLOOD RIGHT FOREARM  Final   Special Requests BOTTLES DRAWN AEROBIC AND ANAEROBIC ANA9ML AER9ML  Final   Culture  Setup Time   Final    GRAM NEGATIVE RODS IN BOTH AEROBIC AND ANAEROBIC BOTTLES CRITICAL RESULT CALLED TO, READ BACK BY AND VERIFIED WITH: NATE COOKSON AT 0037 12/31/16.PMH Performed at  [facility], [place] [address] [place], [place] [address]    Culture ESCHERICHIA COLI (A)  Final   Report Status 01/02/2017 FINAL  Final   Organism ID, Bacteria ESCHERICHIA COLI  Final      Susceptibility   Escherichia coli - MIC*    AMPICILLIN 8 SENSITIVE Sensitive     CEFAZOLIN <=4 SENSITIVE Sensitive     CEFEPIME <=1 SENSITIVE Sensitive     CEFTAZIDIME <=1 SENSITIVE Sensitive     CEFTRIAXONE <=1 SENSITIVE Sensitive     CIPROFLOXACIN <=0.25 SENSITIVE Sensitive     GENTAMICIN <=1 SENSITIVE Sensitive     IMIPENEM <=0.25 SENSITIVE Sensitive     TRIMETH/SULFA <=20 SENSITIVE Sensitive     AMPICILLIN/SULBACTAM 4 SENSITIVE Sensitive     PIP/TAZO <=4 SENSITIVE Sensitive     Extended ESBL NEGATIVE Sensitive     * ESCHERICHIA COLI  Culture, Urine     Status: Abnormal   Collection Time: 12/23/2016 10:32 AM  Result Value Ref Range Status   Specimen Description URINE, RANDOM  Final   Special Requests NONE  Final   Culture 50,000 COLONIES/mL ESCHERICHIA COLI (A)  Final   Report Status 01/01/2017 FINAL  Final   Organism ID, Bacteria ESCHERICHIA COLI (A)  Final      Susceptibility   Escherichia coli - MIC*    AMPICILLIN <=2 SENSITIVE Sensitive     CEFAZOLIN <=4 SENSITIVE Sensitive     CEFTRIAXONE <=1 SENSITIVE Sensitive     CIPROFLOXACIN <=0.25 SENSITIVE Sensitive     GENTAMICIN <=1 SENSITIVE Sensitive     IMIPENEM <=0.25 SENSITIVE Sensitive     NITROFURANTOIN 32 SENSITIVE Sensitive     TRIMETH/SULFA <=20 SENSITIVE Sensitive     AMPICILLIN/SULBACTAM <=2 SENSITIVE Sensitive     PIP/TAZO <=4 SENSITIVE Sensitive     Extended ESBL NEGATIVE Sensitive     * 50,000 COLONIES/mL ESCHERICHIA COLI  Blood Culture ID Panel (Reflexed)     Status: Abnormal   Collection Time: 01/08/2017 10:32 AM  Result Value Ref Range Status   Enterococcus species NOT DETECTED NOT DETECTED Final   Listeria monocytogenes NOT DETECTED NOT DETECTED Final   Staphylococcus species NOT DETECTED NOT DETECTED Final    Staphylococcus aureus NOT DETECTED NOT DETECTED Final   Streptococcus species NOT DETECTED NOT DETECTED Final   Streptococcus agalactiae NOT DETECTED NOT DETECTED Final   Streptococcus pneumoniae NOT DETECTED NOT DETECTED Final   Streptococcus pyogenes NOT DETECTED NOT DETECTED Final   Acinetobacter baumannii NOT DETECTED NOT DETECTED Final   Enterobacteriaceae species DETECTED (A) NOT DETECTED Final    Comment: Enterobacteriaceae represent a large family of gram-negative bacteria, not a single organism. CRITICAL RESULT CALLED TO, READ BACK BY AND VERIFIED WITH: NATE COOKSON AT 0037 12/31/16.PMH    Enterobacter cloacae complex NOT DETECTED NOT DETECTED Final   Escherichia coli DETECTED (A) NOT DETECTED Final    Comment: CRITICAL RESULT CALLED TO, READ BACK BY AND VERIFIED WITH: NATE COOKSON AT 0037 12/31/16.PMH    Klebsiella oxytoca NOT DETECTED NOT DETECTED Final   Klebsiella pneumoniae NOT DETECTED NOT DETECTED Final   Proteus species NOT DETECTED NOT DETECTED Final   Serratia marcescens NOT DETECTED NOT DETECTED Final   Carbapenem resistance NOT DETECTED NOT DETECTED Final   Haemophilus influenzae NOT DETECTED NOT DETECTED Final   Neisseria meningitidis NOT DETECTED NOT DETECTED Final   Pseudomonas aeruginosa NOT DETECTED NOT DETECTED Final   Candida albicans NOT DETECTED NOT DETECTED Final   Candida glabrata NOT DETECTED NOT DETECTED Final   Candida krusei NOT DETECTED NOT DETECTED Final   Candida parapsilosis NOT DETECTED NOT DETECTED Final   Candida tropicalis NOT DETECTED NOT DETECTED Final  Blood Culture (routine x 2)     Status: Abnormal   Collection Time: 12/26/2016 10:33 AM  Result Value Ref Range Status   Specimen Description BLOOD LEFT ARM  Final   Special Requests   Final    BOTTLES DRAWN AEROBIC AND ANAEROBIC ANA11ML AER13ML   Culture  Setup Time   Final    GRAM NEGATIVE RODS IN BOTH AEROBIC AND ANAEROBIC BOTTLES CRITICAL RESULT CALLED TO, READ BACK BY AND VERIFIED  WITH: NATE COOKSON AT 0037 12/31/16.PMH    Culture (A)  Final    ESCHERICHIA COLI SUSCEPTIBILITIES PERFORMED ON PREVIOUS CULTURE WITHIN THE LAST 5 DAYS. Performed at [facility], [place] [address], [place], [place] [address]    Report Status 01/02/2017 FINAL  Final  MRSA PCR Screening     Status: Abnormal   Collection Time: 12/29/2016 10:26 PM  Result Value Ref Range Status   MRSA by  PCR POSITIVE (A) NEGATIVE Final    Comment:        The GeneXpert MRSA Assay (FDA approved for NASAL specimens only), is one component of a comprehensive MRSA colonization surveillance program. It is not intended to diagnose MRSA infection nor to guide or monitor treatment for MRSA infections. RESULT CALLED TO, READ BACK BY AND VERIFIED WITH: LESLIE LEWIS AT 0014 12/31/16.PMH   CULTURE, BLOOD (ROUTINE X 2) w Reflex to ID Panel     Status: None (Preliminary result)   Collection Time: 01/03/17  9:14 AM  Result Value Ref Range Status   Specimen Description BLOOD R ARM  Final   Special Requests BOTTLES DRAWN AEROBIC AND ANAEROBIC BCAV  Final   Culture NO GROWTH < 24 HOURS  Final   Report Status PENDING  Incomplete  CULTURE, BLOOD (ROUTINE X 2) w Reflex to ID Panel     Status: None (Preliminary result)   Collection Time: 01/03/17  9:19 AM  Result Value Ref Range Status   Specimen Description BLOOD R ARM  Final   Special Requests BOTTLES DRAWN AEROBIC AND ANAEROBIC BCAV  Final   Culture NO GROWTH < 24 HOURS  Final   Report Status PENDING  Incomplete   Studies/Results: Dg Chest Port 1 View  Result Date: 01/02/2017 CLINICAL DATA:  70 y/o  M; central line placement. EXAM: PORTABLE CHEST 1 VIEW COMPARISON:  01/01/2017 chest radiograph FINDINGS: Endotracheal tube is 3 cm from the carina. Enteric tube tip projects over the mid stomach. Left central venous catheter tip projects over cavoatrial junction. Low lung volumes. Elevated right hemidiaphragm. Bibasilar opacities probably representing effusion and  atelectasis. No pneumothorax. Stable cardiomegaly. Aortic atherosclerosis with calcification. IMPRESSION: Left central line tip projects over the cavoatrial junction. Stable bibasilar opacities probably representing effusions and atelectasis. Electronically Signed   By: Lance  Furusawa-Stratton M.D.   On: 01/02/2017 15:49   Medications: I have reviewed the patient's current medications. Scheduled Meds: . cefTRIAXone (ROCEPHIN)  IV  2 g Intravenous Q24H  . chlorhexidine gluconate (MEDLINE KIT)  15 mL Mouth Rinse BID  . famotidine  20 mg Per Tube QHS  . fentaNYL (SUBLIMAZE) injection  50 mcg Intravenous Once  . hydrocortisone sod succinate (SOLU-CORTEF) inj  100 mg Intravenous Q8H  . insulin aspart  2-6 Units Subcutaneous Q4H  . insulin glargine  10 Units Subcutaneous Daily  . LORazepam  4 mg Intravenous Once  . mouth rinse  15 mL Mouth Rinse 10 times per day  . mupirocin ointment  1 application Nasal BID  . polyethylene glycol  17 g Oral Daily  . sodium chloride flush  10-40 mL Intracatheter Q12H   Continuous Infusions: . amiodarone 60 mg/hr (01/04/17 0948)  . dextrose    . feeding supplement (VITAL AF 1.2 CAL) 1,000 mL (01/04/17 0538)  . fentaNYL 400 mcg/hr (01/04/17 1015)  . norepinephrine (LEVOPHED) Adult infusion 2 mcg/min (01/04/17 1020)  . phenylephrine (NEO-SYNEPHRINE) Adult infusion Stopped (01/02/17 1212)  . pureflow 3 each (01/04/17 1022)  . vasopressin (PITRESSIN) infusion - *FOR SHOCK* 0.03 Units/min (01/03/17 1945)   PRN Meds:.sodium chloride, dextrose, fentaNYL, heparin, midazolam, sennosides   Assessment: Active Problems:   Acute respiratory failure with hypoxia (HCC)   Severe sepsis (HCC)   Acute renal failure (HCC)   Chronic atrial fibrillation (HCC)   Elevated bilirubin   Bacteremia due to Escherichia coli   Pressure injury of skin  Xavier King 70 y.o. male with history of atrial fibrillation , HTN,MDS, systolic dysfunction of the   heart EF 30 % admitted  with sepsis, respiratory failure and on pressors with AKI. USG shows no biliary dilation . His INR is down from >2 to 1.47 which suggests improvement in liver function . Presently still on pressors , tachycardic , overall not doing well. Transaminases have improved and INR is stable but bilirubin is still high which may be due to decreased excretion from poor kidney function . It is also possible that the amiodarone is contributing to abnormal transaminases. Still on pressors, hypotensive and tachycardic.   Plan: 1. Continue treatment of sepsis , monitor LFT's, INR 2. F/u on serologies sent yesterday .  3. If possible chose alternative rate controlling agent for A fibb due to associated hepatotoxicity of amiodarone but may have no other options due to low blood pressure.    LOS: 5 days     01/04/2017, 10:42 AM 

## 2017-01-04 NOTE — Progress Notes (Signed)
Renal panel resulted and potassium remains less than 4.5 therefore per Dr. Elwyn Lade order(as discussed this morning, will change dialysate bath to 4K+.

## 2017-01-04 NOTE — Progress Notes (Signed)
Pharmacy consulted for CRRT medication review. No further adjustments warranted at this time. Pharmacy will continue to monitor daily for renal adjustments.    Ulice Dash, PharmD Clinical Pharmacist

## 2017-01-04 NOTE — Progress Notes (Signed)
Nutrition Follow-up  DOCUMENTATION CODES:   Not applicable  INTERVENTION:  -Recommend decreasing Vital AF 1.2 goal rate fo 50 ml/hr with addition of Prostat 30 mL 6 times daily providing 2040 kcals, 180 g of protein and 971 mL of free water. Continue to assess   NUTRITION DIAGNOSIS:   Inadequate oral intake related to inability to eat as evidenced by NPO status.  Being addressed via TF  GOAL:   Patient will meet greater than or equal to 90% of their needs  Met  MONITOR:   Labs, Weight trends, Vent status, I & O's, TF tolerance  REASON FOR ASSESSMENT:   Ventilator, Malnutrition Screening Tool    ASSESSMENT:   Pt remains on vent support, multi-organ failure, remains on pressors Continues on CRRT, UF increased to 175 ml/hr, changed to 2K bath Tolerating Vital AF 1.2 at rate of 65 ml/hr Labs: reviewed Meds: ss novolog, lantus  Diet Order:  Diet NPO time specified  Skin:  Reviewed, no issues  Last BM:  no documented BM  Height:   Ht Readings from Last 1 Encounters:  01/02/2017 '6\' 1"'  (1.854 m)    Weight:   Wt Readings from Last 1 Encounters:  01/04/17 219 lb 2.2 oz (99.4 kg)    Ideal Body Weight:  83.63 kg  BMI:  Body mass index is 28.91 kg/m.  Estimated Nutritional Needs:   Kcal:  1916 calories  Protein:  >/= 186 g  Fluid:  Per Nephrology  EDUCATION NEEDS:   No education needs identified at this time  Kerman Passey [place], [place], [place] [phone] Pager  [phone] Weekend/On-Call Pager

## 2017-01-04 NOTE — Progress Notes (Signed)
PULMONARY / CRITICAL CARE MEDICINE   Name: Xavier King MRN: [id number] DOB: 1946-09-01    ADMISSION DATE:  01/01/2017 CONSULTATION DATE:  12/22/2016  REFERRING MD:  Dr. Mable Paris   CHIEF COMPLAINT:  Altered Mental Status   Discussion: 71 yo male with myelodysplastic syndrome, presents in respiratory failure.  Prior Hx of cardiomyopathy with EF of 30-35%,renal insufficiency, hemodynamic compromise, Rapid afib, hyperbilirubinemia, bactremia.  REVIEW OF SYSTEMS:   Unable to assess pt intubated  SUBJECTIVE:  Pt currently sedated and intubated   VITAL SIGNS: BP 98/72   Pulse (!) 122   Temp 98.1 F (36.7 C)   Resp 20   Ht 6\' 1"  (1.854 m)   Wt 97.7 kg (215 lb 6.2 oz)   SpO2 97%   BMI 28.42 kg/m   HEMODYNAMICS: CVP:  [9 mmHg-14 mmHg] 9 mmHg  VENTILATOR SETTINGS: Vent Mode: PRVC FiO2 (%):  [30 %] 30 % Set Rate:  [20 bmp] 20 bmp Vt Set:  [500 mL] 500 mL PEEP:  [5 cmH20] 5 cmH20  INTAKE / OUTPUT: I/O last 3 completed shifts: In: 7447.9 [I.V.:5805.4; NG/GT:1542.5; IV Piggyback:100] Out: 1212 [Urine:344; Other:868]  PHYSICAL EXAMINATION: General: acutely ill Caucasian male , jaundiced, intubated and on mechanical ventilation Neuro: sedated  HEENT: supple, mild JVD Cardiovascular: irregular irregular, no M/R/G Lungs: Scattered rhonchi, no wheezes, crackles noted :Abdomen: +BS x4, mildly distended, soft  Musculoskeletal: 1+ bilateral lower extremity pitting edema  Skin: generalized jaundice   LABS:  BMET  Recent Labs Lab 01/03/17 1426 01/03/17 1954 01/03/17 2337  NA 136 138 137  K 4.2 4.4 4.3  CL 104 105 103  CO2 25 26 26   BUN 68* 61* 58*  CREATININE 1.86* 1.66* 1.61*  GLUCOSE 157* 162* 171*    Electrolytes  Recent Labs Lab 01/02/17 1829  01/03/17 0542  01/03/17 1426 01/03/17 1806 01/03/17 1954 01/03/17 2337  CALCIUM 7.7*  < > 7.7*  < > 7.7*  --  8.2* 7.8*  MG 2.2  --  1.9  --   --  1.8  --   --   PHOS NOT VALID  < > UNABLE TO RESULT DUE TO  ICTERUS  UNABLE TO REPORT DUE TO ICTERUS  < > UNABLE TO REPORT DUE TO ICTERUS JLJ UNABLE TO REPORT DUE TO ICTERUS JLJ UNABLE TO REPORT DUE TO ICTERUS JLJ UNABLE TO REPORT DUE TO ICTERUS  < > = values in this interval not displayed.  CBC  Recent Labs Lab 01/01/17 0400 01/02/17 0416 01/03/17 0915  WBC 4.3 3.1* 5.4  HGB 9.8* 9.8* 8.3*  HCT 28.3* 28.5* 23.6*  PLT 88* 107* 82*    Coag's  Recent Labs Lab 12/29/2016 1031 01/03/17 1229  INR 2.40 1.47    Sepsis Markers  Recent Labs Lab 12/24/2016 1842  12/31/16 0402  01/01/17 0400 01/01/17 2323 01/02/17 0416  LATICACIDVEN  --   < >  --   < > 2.7* 1.9 2.1*  PROCALCITON 7.35  --  12.56  --  14.08  --   --   < > = values in this interval not displayed.  ABG  Recent Labs Lab 12/31/16 0500 01/02/17 0835 01/03/17 1040  PHART 7.34* 7.29* 7.41  PCO2ART 39 40 40  PO2ART 120* 63* 77*    Liver Enzymes  Recent Labs Lab 12/31/2016 1031 12/31/16 0402 01/01/17 0400 01/02/17 0416  01/03/17 1426 01/03/17 1954 01/03/17 1955 01/03/17 2337  AST 106* 51* 40 45*  --   --   --  68*  --  ALT 497* 292* 211* 158*  --   --   --   --   --   ALKPHOS 71  --  60 61  --   --   --   --   --   BILITOT 18.9* 17.0* 18.4* 18.4*  --   --   --   --   --   ALBUMIN 2.5*  --  1.8* 1.7*  < > 1.7* 1.8*  --  1.7*  < > = values in this interval not displayed.  Cardiac Enzymes  Recent Labs Lab 12/21/2016 1031 12/25/2016 1842 12/31/16 0007  TROPONINI <0.03 <0.03 <0.03    Glucose  Recent Labs Lab 01/03/17 0953 01/03/17 1053 01/03/17 1228 01/03/17 1608 01/03/17 1937 01/03/17 2332  GLUCAP 121* 139* 159* 154* 151* 156*    Imaging No results found. STUDIES:  CT Head 02/16>>Atrophy with minimal small vessel chronic ischemic changes of deep cerebral white matter. No acute intracranial abnormalities. 2/16 Renal Ultrasound>>No hydronephrosis 12/31/16 ECHO>The cavity size was mildly dilated. Systolic  function was severely reduced. The estimated  ejection fraction  was in the range of 20% to 25%. CULTURES: Blood 02/16>> Positive for E-Coli Urine 02/16>>positive for E-coli Influenza PCR>>negative   ANTIBIOTICS: Vancomycin 02/16>>2/18 Cefepime 02/16>>2/16  2/19Ceftriaxone>>  2/16 meropenem>>2/18  SIGNIFICANT EVENTS: 02/16-Pt admitted to [facility] ICU with acute hypoxic respiratory failure secondary to CHF, ?HCAP with sepsis, and Afibb with RVR PCCM contacted to admit pt 2 LINES/TUBES: PIV's 02/16>> 2/16 Right Femoral CVL>> 2/19 HD catheter>>  ASSESSMENT / PLAN:  PULMONARY A: Acute hypoxic respiratory failure secondary to CHF and sepsis with bactremia Atelectasis P:   Full vent support for now  Maintain O2 sats >92% VAP Bundle Routine ABG Routine CXR  CARDIOVASCULAR A:  Afibb with RVR Septic shock  CHF Cardiomegaly  Hx: Chronic afibb (current outpatient anticoagulation Eliquis) and HTN  P:  Continuous telemetry monitoring  Continue Vaso/levo for a MAP goal> 65 Amiodarone gtt  Cardiology consulted appreciate input Trend troponin's  Will hold off on diuresis for now due to hypotension  RENAL A:   Acute renal failure due to ATN from septic shock P:   Trend BMP's Replace electrolytes as indicated  Monitor UOP On CRRT Renal ultrasound 2/16 negative for hydronephrosis Nephrology consulted, input appreciated  GASTROINTESTINAL A:   Elevated Bilirubin  Elevated Liver Enzymes P:   Pecid for PUD prophylaxis  Keep NPO for now  OG tube to LIS GI consulted, input appreciated F/U on LFT'S, INR and acute Hepatitis B serology Repeat liver enzyme and bilirubin levels in the am   HEMATOLOGIC A:   Thrombocytopenia Neutropenia secondary to MDS  Hx: Melanoma and Myelodysplastic syndrome  P:  SCD's for VTE prophylaxis will hold off on mechanical anticoagulation for now  Trend CBC Monitor for s/sx of bleeding Transfuse for hgb <7 Neutropenic precautions  INFECTIOUS A:  Bactremia with ECOLI UTI P:    Trend WBC and monitor fever curve Continue Ceftriaxone  ENDOCRINE A:   Diabetes Mellitus P:   CBG's q4hrs SSI   NEUROLOGIC A:   Acute encephalopathy likely secondary to sepsis  P:   RASS goal: 0 to -1 Propofol gtt and Fentanyl gtt to maintain RASS goal  Prn Fentanyl for pain management     Konni Kesinger,AG-ACNP Pulmonary & Critical Care

## 2017-01-04 NOTE — Progress Notes (Signed)
RN made Dr. Holley Raring aware that patient's potasium resulted at 5.4, MD gave order to change patient's dialysate bath to 2K+ and if potassium gets back down to 4.5 to change dialysate back to 4K+. MD also gave order for ultrafiltration to be changed to 175.

## 2017-01-04 NOTE — Progress Notes (Signed)
RN notified Dr. Vicente Males via telephone of critical total bilirubin of 18.8, MD acknowledged and gave no new orders.

## 2017-01-04 NOTE — Progress Notes (Signed)
RN spoke with Dr. Mortimer Fries and made MD aware that patient's distal port on central line does not have blood return but does flush therefore unable to obtain accurate CVP.  RN asked about using activase in distal lumen and MD stated "discontinue CVPs."  MD gave no further orders.

## 2017-01-04 NOTE — Progress Notes (Signed)
Approximately 0030 CRRT filter clotted. Restarted therapy at 0200.

## 2017-01-04 NOTE — Progress Notes (Signed)
[facility]  ROUNDING NOTE   Subjective:  Patient remains critically ill. Urine output was only 47 cc over the preceding 24 hours. He still appears to have multiorgan failure. Potassium a bit high at 5.4 this a.m. Family updated on current prognosis which appears to be poor.  Objective:  Vital signs in last 24 hours:  Temp:  [97 F (36.1 C)-98.2 F (36.8 C)] 97.2 F (36.2 C) (02/21 1000) Pulse Rate:  [87-146] 120 (02/21 1000) Resp:  [17-23] 20 (02/21 1000) BP: (66-126)/(45-96) 88/69 (02/21 1000) SpO2:  [92 %-99 %] 95 % (02/21 1000) FiO2 (%):  [30 %] 30 % (02/21 0400) Weight:  [99.4 kg (219 lb 2.2 oz)] 99.4 kg (219 lb 2.2 oz) (02/21 0415)  Weight change: 1.7 kg (3 lb 12 oz) Filed Weights   01/02/17 0443 01/03/17 0449 01/04/17 0415  Weight: 95.3 kg (210 lb 1.6 oz) 97.7 kg (215 lb 6.2 oz) 99.4 kg (219 lb 2.2 oz)    Intake/Output: I/O last 3 completed shifts: In: 7555.5 [I.V.:5248.3; NG/GT:2257.2; IV Piggyback:50] Out: 1934 [Urine:122; PVVZS:8270]   Intake/Output this shift:  Total I/O In: 598.9 [I.V.:253.9; NG/GT:345] Out: 194 [Other:194]  Physical Exam: General: Critically ill appearing  Head: Normocephalic, atraumatic. Moist oral mucosal membranes  Eyes: Icterus noted  Neck: Supple, trachea midline  Lungs:  Scattered rhonchi, vent assisted  Heart: S1S2 irregular tachycardic  Abdomen:  Soft, nontender, bowel sounds present  Extremities: 2+ peripheral edema.  Neurologic: Sedated  Skin: No lesions, jaundice noted  Access: IJ permcath in place    Basic Metabolic Panel:  Recent Labs Lab 01/02/17 1216 01/02/17 1829  01/03/17 0542  01/03/17 1426 01/03/17 1806 01/03/17 1954 01/03/17 2337 01/04/17 0351 01/04/17 0823  NA  --  137  < > 140  < > 136  --  138 137 138 138  K  --  4.4  < > 4.0  < > 4.2  --  4.4 4.3 4.3 5.4*  CL  --  106  < > 106  < > 104  --  105 103 106 104  CO2  --  21*  < > 25  < > 25  --  _0 GLUCOSE  --  236*  < > 109*   < > 157*  --  162* 171* 184* 183*  BUN  --  104*  < > 80*  < > 68*  --  61* 58* 58* 57*  CREATININE  --  2.62*  < > 2.07*  < > 1.86*  --  1.66* 1.61* 1.73* 0.85  CALCIUM  --  7.7*  < > 7.7*  < > 7.7*  --  8.2* 7.8* 7.7* 7.9*  MG 2.2 2.2  --  1.9  --   --  1.8  --   --  1.8  --   PHOS UNABLE TO REPORT DUE TO ICTERIC INTERFERENCE NOT VALID  < > UNABLE TO RESULT DUE TO ICTERUS  UNABLE TO REPORT DUE TO ICTERUS  < > UNABLE TO REPORT DUE TO ICTERUS JLJ UNABLE TO REPORT DUE TO ICTERUS JLJ UNABLE TO REPORT DUE TO ICTERUS JLJ UNABLE TO REPORT DUE TO ICTERUS UNABLE TO REPORT DUE TO ICTERUS UNABLE TO REPORT DUE TO ICTERUS SDR  < > = values in this interval not displayed.  Liver Function Tests:  Recent Labs Lab 12/27/2016 1031 12/31/16 0402 01/01/17 0400 01/02/17 0416  01/03/17 1426 01/03/17 1954 01/03/17 1955 01/03/17 2337 01/04/17 0351 01/04/17 0823  AST 106*  51* 40 45*  --   --   --  68*  --   --   --   ALT 497* 292* 211* 158*  --   --   --   --   --  109*  --   ALKPHOS 71  --  60 61  --   --   --   --   --   --   --   BILITOT 18.9* 17.0* 18.4* 18.4*  --   --   --   --   --   --   --   PROT 5.8*  --  5.1* 4.9*  --   --   --   --   --   --   --   ALBUMIN 2.5*  --  1.8* 1.7*  < > 1.7* 1.8*  --  1.7* 1.6* 1.7*  < > = values in this interval not displayed.  Recent Labs Lab 12/25/2016 1031 12/31/16 0402  LIPASE 18 19  AMYLASE  --  7*    Recent Labs Lab 01/04/17 0351  AMMONIA 12    CBC:  Recent Labs Lab 01/01/2017 1031 12/31/16 0402 01/01/17 0400 01/02/17 0416 01/03/17 0915 01/04/17 0351  WBC 1.1* 2.4* 4.3 3.1* 5.4 4.0  NEUTROABS 0.7*  --   --   --  5.0 3.8  HGB 9.9* 8.8* 9.8* 9.8* 8.3* 7.6*  HCT 28.2* 26.5* 28.3* 28.5* 23.6* 21.5*  MCV 93.8 95.9 92.9 94.2 90.3 90.6  PLT 91* 72* 88* 107* 82* 83*    Cardiac Enzymes:  Recent Labs Lab 12/26/2016 1031 12/17/2016 1842 12/31/16 0007 01/02/17 1829  CKTOTAL  --   --   --  17*  TROPONINI <0.03 <0.03 <0.03  --      BNP: Invalid input(s): POCBNP  CBG:  Recent Labs Lab 01/03/17 1937 01/03/17 2332 01/04/17 0358 01/04/17 0740 01/04/17 0942  GLUCAP 151* 156* 163* 17* 68*    Microbiology: Results for orders placed or performed during the hospital encounter of 01/09/2017  Blood Culture (routine x 2)     Status: Abnormal   Collection Time: 01/04/2017 10:32 AM  Result Value Ref Range Status   Specimen Description BLOOD RIGHT FOREARM  Final   Special Requests BOTTLES DRAWN AEROBIC AND ANAEROBIC ANA9ML AER9ML  Final   Culture  Setup Time   Final    GRAM NEGATIVE RODS IN BOTH AEROBIC AND ANAEROBIC BOTTLES CRITICAL RESULT CALLED TO, READ BACK BY AND VERIFIED WITH: NATE COOKSON AT 0037 12/31/16.PMH Performed at [facility], [place] [address], [place], [place] [address]    Culture ESCHERICHIA COLI (A)  Final   Report Status 01/02/2017 FINAL  Final   Organism ID, Bacteria ESCHERICHIA COLI  Final      Susceptibility   Escherichia coli - MIC*    AMPICILLIN 8 SENSITIVE Sensitive     CEFAZOLIN <=4 SENSITIVE Sensitive     CEFEPIME <=1 SENSITIVE Sensitive     CEFTAZIDIME <=1 SENSITIVE Sensitive     CEFTRIAXONE <=1 SENSITIVE Sensitive     CIPROFLOXACIN <=0.25 SENSITIVE Sensitive     GENTAMICIN <=1 SENSITIVE Sensitive     IMIPENEM <=0.25 SENSITIVE Sensitive     TRIMETH/SULFA <=20 SENSITIVE Sensitive     AMPICILLIN/SULBACTAM 4 SENSITIVE Sensitive     PIP/TAZO <=4 SENSITIVE Sensitive     Extended ESBL NEGATIVE Sensitive     * ESCHERICHIA COLI  Culture, Urine     Status: Abnormal   Collection Time: 12/22/2016 10:32 AM  Result Value Ref Range Status   Specimen Description URINE, RANDOM  Final   Special Requests NONE  Final   Culture 50,000 COLONIES/mL ESCHERICHIA COLI (A)  Final   Report Status 01/01/2017 FINAL  Final   Organism ID, Bacteria ESCHERICHIA COLI (A)  Final      Susceptibility   Escherichia coli - MIC*    AMPICILLIN <=2 SENSITIVE Sensitive     CEFAZOLIN <=4 SENSITIVE Sensitive      CEFTRIAXONE <=1 SENSITIVE Sensitive     CIPROFLOXACIN <=0.25 SENSITIVE Sensitive     GENTAMICIN <=1 SENSITIVE Sensitive     IMIPENEM <=0.25 SENSITIVE Sensitive     NITROFURANTOIN 32 SENSITIVE Sensitive     TRIMETH/SULFA <=20 SENSITIVE Sensitive     AMPICILLIN/SULBACTAM <=2 SENSITIVE Sensitive     PIP/TAZO <=4 SENSITIVE Sensitive     Extended ESBL NEGATIVE Sensitive     * 50,000 COLONIES/mL ESCHERICHIA COLI  Blood Culture ID Panel (Reflexed)     Status: Abnormal   Collection Time: 12/28/2016 10:32 AM  Result Value Ref Range Status   Enterococcus species NOT DETECTED NOT DETECTED Final   Listeria monocytogenes NOT DETECTED NOT DETECTED Final   Staphylococcus species NOT DETECTED NOT DETECTED Final   Staphylococcus aureus NOT DETECTED NOT DETECTED Final   Streptococcus species NOT DETECTED NOT DETECTED Final   Streptococcus agalactiae NOT DETECTED NOT DETECTED Final   Streptococcus pneumoniae NOT DETECTED NOT DETECTED Final   Streptococcus pyogenes NOT DETECTED NOT DETECTED Final   Acinetobacter baumannii NOT DETECTED NOT DETECTED Final   Enterobacteriaceae species DETECTED (A) NOT DETECTED Final    Comment: Enterobacteriaceae represent a large family of gram-negative bacteria, not a single organism. CRITICAL RESULT CALLED TO, READ BACK BY AND VERIFIED WITH: NATE COOKSON AT 0037 12/31/16.PMH    Enterobacter cloacae complex NOT DETECTED NOT DETECTED Final   Escherichia coli DETECTED (A) NOT DETECTED Final    Comment: CRITICAL RESULT CALLED TO, READ BACK BY AND VERIFIED WITH: NATE COOKSON AT 0037 12/31/16.PMH    Klebsiella oxytoca NOT DETECTED NOT DETECTED Final   Klebsiella pneumoniae NOT DETECTED NOT DETECTED Final   Proteus species NOT DETECTED NOT DETECTED Final   Serratia marcescens NOT DETECTED NOT DETECTED Final   Carbapenem resistance NOT DETECTED NOT DETECTED Final   Haemophilus influenzae NOT DETECTED NOT DETECTED Final   Neisseria meningitidis NOT DETECTED NOT DETECTED Final    Pseudomonas aeruginosa NOT DETECTED NOT DETECTED Final   Candida albicans NOT DETECTED NOT DETECTED Final   Candida glabrata NOT DETECTED NOT DETECTED Final   Candida krusei NOT DETECTED NOT DETECTED Final   Candida parapsilosis NOT DETECTED NOT DETECTED Final   Candida tropicalis NOT DETECTED NOT DETECTED Final  Blood Culture (routine x 2)     Status: Abnormal   Collection Time: 12/20/2016 10:33 AM  Result Value Ref Range Status   Specimen Description BLOOD LEFT ARM  Final   Special Requests   Final    BOTTLES DRAWN AEROBIC AND ANAEROBIC ANA11ML AER13ML   Culture  Setup Time   Final    GRAM NEGATIVE RODS IN BOTH AEROBIC AND ANAEROBIC BOTTLES CRITICAL RESULT CALLED TO, READ BACK BY AND VERIFIED WITH: NATE COOKSON AT 0037 12/31/16.PMH    Culture (A)  Final    ESCHERICHIA COLI SUSCEPTIBILITIES PERFORMED ON PREVIOUS CULTURE WITHIN THE LAST 5 DAYS. Performed at [facility], [place] [address] [place], [place] [address]    Report Status 01/02/2017 FINAL  Final  MRSA PCR Screening  Status: Abnormal   Collection Time: 12/29/2016 10:26 PM  Result Value Ref Range Status   MRSA by PCR POSITIVE (A) NEGATIVE Final    Comment:        The GeneXpert MRSA Assay (FDA approved for NASAL specimens only), is one component of a comprehensive MRSA colonization surveillance program. It is not intended to diagnose MRSA infection nor to guide or monitor treatment for MRSA infections. RESULT CALLED TO, READ BACK BY AND VERIFIED WITH: LESLIE LEWIS AT 0014 12/31/16.PMH   CULTURE, BLOOD (ROUTINE X 2) w Reflex to ID Panel     Status: None (Preliminary result)   Collection Time: 01/03/17  9:14 AM  Result Value Ref Range Status   Specimen Description BLOOD R ARM  Final   Special Requests BOTTLES DRAWN AEROBIC AND ANAEROBIC BCAV  Final   Culture NO GROWTH < 24 HOURS  Final   Report Status PENDING  Incomplete  CULTURE, BLOOD (ROUTINE X 2) w Reflex to ID Panel     Status: None (Preliminary result)    Collection Time: 01/03/17  9:19 AM  Result Value Ref Range Status   Specimen Description BLOOD R ARM  Final   Special Requests BOTTLES DRAWN AEROBIC AND ANAEROBIC BCAV  Final   Culture NO GROWTH < 24 HOURS  Final   Report Status PENDING  Incomplete    Coagulation Studies:  Recent Labs  01/03/17 1229 01/04/17 0351  LABPROT 18.0* 18.8*  INR 1.47 1.56    Urinalysis: No results for input(s): COLORURINE, LABSPEC, PHURINE, GLUCOSEU, HGBUR, BILIRUBINUR, KETONESUR, PROTEINUR, UROBILINOGEN, NITRITE, LEUKOCYTESUR in the last 72 hours.  Invalid input(s): APPERANCEUR    Imaging: Dg Chest Port 1 View  Result Date: 01/02/2017 CLINICAL DATA:  71 y/o  M; central line placement. EXAM: PORTABLE CHEST 1 VIEW COMPARISON:  01/01/2017 chest radiograph FINDINGS: Endotracheal tube is 3 cm from the carina. Enteric tube tip projects over the mid stomach. Left central venous catheter tip projects over cavoatrial junction. Low lung volumes. Elevated right hemidiaphragm. Bibasilar opacities probably representing effusion and atelectasis. No pneumothorax. Stable cardiomegaly. Aortic atherosclerosis with calcification. IMPRESSION: Left central line tip projects over the cavoatrial junction. Stable bibasilar opacities probably representing effusions and atelectasis. Electronically Signed   By: Kristine Garbe M.D.   On: 01/02/2017 15:49     Medications:   . amiodarone 60 mg/hr (01/04/17 0948)  . dextrose    . feeding supplement (VITAL AF 1.2 CAL) 1,000 mL (01/04/17 0538)  . fentaNYL 400 mcg/hr (01/04/17 0352)  . norepinephrine (LEVOPHED) Adult infusion 4 mcg/min (01/04/17 0800)  . phenylephrine (NEO-SYNEPHRINE) Adult infusion Stopped (01/02/17 1212)  . pureflow    . vasopressin (PITRESSIN) infusion - *FOR SHOCK* 0.03 Units/min (01/03/17 1945)   . cefTRIAXone (ROCEPHIN)  IV  2 g Intravenous Q24H  . chlorhexidine gluconate (MEDLINE KIT)  15 mL Mouth Rinse BID  . famotidine  20 mg Per Tube QHS  .  fentaNYL (SUBLIMAZE) injection  50 mcg Intravenous Once  . hydrocortisone sod succinate (SOLU-CORTEF) inj  100 mg Intravenous Q8H  . insulin aspart  2-6 Units Subcutaneous Q4H  . insulin glargine  10 Units Subcutaneous Daily  . LORazepam  4 mg Intravenous Once  . mouth rinse  15 mL Mouth Rinse 10 times per day  . mupirocin ointment  1 application Nasal BID  . polyethylene glycol  17 g Oral Daily  . sodium chloride flush  10-40 mL Intracatheter Q12H   sodium chloride, dextrose, fentaNYL, heparin, midazolam, sennosides  Assessment/ Plan:  71 y.o. male  with a PMHx of Atrial fibrillation, diabetes mellitus type 2, hypertension, myelodysplastic syndrome, congestive heart failure ejection fraction 30-35%, and melanoma, who was admitted to [facility] on 12/20/2016 for evaluation of altered mental status.   1.  Acute renal failure due to ATN from septic shock 2.  Septic shock, E. Coli in the blood 3.  Acute respiratory failure. 4.  Anemia unspecified, thrombocytopenia. 5.  Hepatic dysfunction. 6.  Hyperkalemia.   Plan:  Overall the patient remains critically ill. He has very little urine output at this point in time. He remains on multiple pressors. He continues to have considerable volume input. Therefore we will increase ultrafiltration target to 175 cc.  Dr. Mortimer Fries and I had a family meeting today. We explained that the patient has/system organ failure. As such she has a very poor prognosis. The patient has been made DO NOT RESUSCITATE at this time. However we will continue current level of care for the moment. Continue to monitor electrolytes closely. Hyperkalemia also noted and patient placed on a 2K bath.  LOS: 5 Xavier King 2/21/201810:10 AM

## 2017-01-04 NOTE — Progress Notes (Signed)
Dr. Mortimer Fries present and RN discussed with MD that patient becomes agitated with assessment along with care and that patient is maxed out on 400 mcg/H of fentanyl.  With agitation patient's heart rate goes up to 130-140's afib.  When not agitated heart rate maintains in 1 teens, on amiodarone drip at 60 mg/H.  RN also discussed with MD about wake up assessment.  Dr. Mortimer Fries stated "give patient 4 mg of ativan for the agitation and do not perform wakeup assessment this morning."

## 2017-01-04 NOTE — Progress Notes (Signed)
Dr. Holley Raring and I met with whole family(wife,son and sister) we have discussed very poor prognosis in setting of severe renal failure and liver failure with underlying MDS with multiorgan failure, we explained and described code status and they have consented and agreed to DNR status  Will update family daily and will address goals of care.   Patient/Family are satisfied with Plan of action and management. All questions answered  Corrin Parker, M.D.  [facility]  Medical Director [place] Director [facility] Cardio-Pulmonary Department

## 2017-01-05 DIAGNOSIS — N170 Acute kidney failure with tubular necrosis: Secondary | ICD-10-CM

## 2017-01-05 DIAGNOSIS — J9601 Acute respiratory failure with hypoxia: Secondary | ICD-10-CM

## 2017-01-05 DIAGNOSIS — R7881 Bacteremia: Secondary | ICD-10-CM

## 2017-01-05 LAB — RENAL FUNCTION PANEL
ALBUMIN: 1.8 g/dL — AB (ref 3.5–5.0)
ANION GAP: 8 (ref 5–15)
ANION GAP: 6 (ref 5–15)
Albumin: 1.9 g/dL — ABNORMAL LOW (ref 3.5–5.0)
Albumin: 1.8 g/dL — ABNORMAL LOW (ref 3.5–5.0)
Anion gap: 6 (ref 5–15)
BUN: 48 mg/dL — ABNORMAL HIGH (ref 6–20)
BUN: 48 mg/dL — ABNORMAL HIGH (ref 6–20)
BUN: 48 mg/dL — AB (ref 6–20)
CALCIUM: 8.2 mg/dL — AB (ref 8.9–10.3)
CHLORIDE: 102 mmol/L (ref 101–111)
CO2: 27 mmol/L (ref 22–32)
CO2: 27 mmol/L (ref 22–32)
CO2: 27 mmol/L (ref 22–32)
CREATININE: 1.28 mg/dL — AB (ref 0.61–1.24)
Calcium: 8.1 mg/dL — ABNORMAL LOW (ref 8.9–10.3)
Calcium: 8.1 mg/dL — ABNORMAL LOW (ref 8.9–10.3)
Chloride: 104 mmol/L (ref 101–111)
Chloride: 103 mmol/L (ref 101–111)
Creatinine, Ser: 1.41 mg/dL — ABNORMAL HIGH (ref 0.61–1.24)
Creatinine, Ser: 1.3 mg/dL — ABNORMAL HIGH (ref 0.61–1.24)
GFR calc Af Amer: 57 mL/min — ABNORMAL LOW (ref >60)
GFR calc Af Amer: >60 mL/min (ref >60)
GFR calc non Af Amer: 49 mL/min — ABNORMAL LOW (ref >60)
GFR calc non Af Amer: 55 mL/min — ABNORMAL LOW (ref >60)
GFR, EST NON AFRICAN AMERICAN: 54 mL/min — AB (ref >60)
GLUCOSE: 247 mg/dL — AB (ref 65–99)
Glucose, Bld: 243 mg/dL — ABNORMAL HIGH (ref 65–99)
Glucose, Bld: 219 mg/dL — ABNORMAL HIGH (ref 65–99)
PHOSPHORUS: UNDETERMINED mg/dL (ref 2.5–4.6)
PHOSPHORUS: 1.1 mg/dL — AB (ref 2.5–4.6)
POTASSIUM: 4.5 mmol/L (ref 3.5–5.1)
POTASSIUM: 4.5 mmol/L (ref 3.5–5.1)
Phosphorus: 3.6 mg/dL (ref 2.5–4.6)
Potassium: 4.2 mmol/L (ref 3.5–5.1)
SODIUM: 136 mmol/L (ref 135–145)
Sodium: 137 mmol/L (ref 135–145)
Sodium: 137 mmol/L (ref 135–145)

## 2017-01-05 LAB — GLUCOSE, CAPILLARY
GLUCOSE-CAPILLARY: 207 mg/dL — AB (ref 65–99)
Glucose-Capillary: 230 mg/dL — ABNORMAL HIGH (ref 65–99)

## 2017-01-05 LAB — HEPATITIS C VRS RNA DETECT BY PCR-QUAL: Hepatitis C Vrs RNA by PCR-Qual: NEGATIVE

## 2017-01-05 MED ORDER — MORPHINE 100MG IN NS 100ML (1MG/ML) PREMIX INFUSION
10.0000 mg/h | INTRAVENOUS | Status: DC
  Administered 2017-01-05: 15 mg/h via INTRAVENOUS
  Administered 2017-01-05: 10 mg/h via INTRAVENOUS
  Filled 2017-01-05 (×2): qty 100

## 2017-01-05 MED ORDER — GLYCOPYRROLATE 0.2 MG/ML IJ SOLN
0.2000 mg | INTRAMUSCULAR | Status: DC | PRN
  Administered 2017-01-05: 0.2 mg via INTRAVENOUS
  Filled 2017-01-05: qty 1

## 2017-01-05 MED ORDER — LORAZEPAM 2 MG/ML IJ SOLN
2.0000 mg | INTRAMUSCULAR | Status: DC | PRN
  Administered 2017-01-05: 2 mg via INTRAVENOUS
  Filled 2017-01-05: qty 1

## 2017-01-05 MED ORDER — MORPHINE BOLUS VIA INFUSION
5.0000 mg | INTRAVENOUS | Status: DC | PRN
  Administered 2017-01-05 (×2): 5 mg via INTRAVENOUS
  Filled 2017-01-05: qty 20

## 2017-01-06 ENCOUNTER — Telehealth: Payer: Self-pay

## 2017-01-06 NOTE — Telephone Encounter (Signed)
Spoke with Xavier King and she is going to bring a blank death cert by office for DK to fill out.

## 2017-01-06 NOTE — Telephone Encounter (Signed)
Death certificate filled out by DK and given to Nursing supervisor, Lelon Frohlich for [facility]. Nothing further needed.

## 2017-01-06 NOTE — Telephone Encounter (Signed)
Patient expired at [place].  Per supervisor Lelon Frohlich a death certificate needs to accompany patient as he is out of state.  Please call [phone] re death certificate asap.

## 2017-01-08 LAB — CULTURE, BLOOD (ROUTINE X 2)
CULTURE: NO GROWTH
CULTURE: NO GROWTH

## 2017-01-12 NOTE — Progress Notes (Signed)
I discussed case with family, and they have expressed that patient would never want to live on machines, they have consented and all agreed to comfort care measures.  Will start morphine infusion and order CMO orders    Patient/Family are satisfied with Plan of action and management. All questions answered  Corrin Parker, M.D.  [facility]  Medical Director [place] Director [facility] Cardio-Pulmonary Department

## 2017-01-12 NOTE — Progress Notes (Signed)
Patient extubated to comfort care at 1020 by Amy, RRT.  Patient has snoring respirations therefore morphine bolus via infusion given. Morphine drip started prior to extubation.  Wife and sister at bedside.  Chaplain present outside of room available for support for family.

## 2017-01-12 NOTE — Progress Notes (Signed)
Nutrition Brief Note  Chart reviewed. Pt now transitioning to comfort care.  No further nutrition interventions warranted at this time.  Please re-consult as needed.   Kerman Passey [place], [place], LDN [phone] Pager  [phone] Weekend/On-Call Pager

## 2017-01-12 NOTE — Progress Notes (Signed)
Pt. Was suctioned prior to extubation for a small amount of thick white secretions. He was extubated to comfort care per Dr. Zoila Shutter order.

## 2017-01-12 NOTE — Progress Notes (Signed)
Inpatient Diabetes Program Recommendations  AACE/ADA: New Consensus Statement on Inpatient Glycemic Control (2015)  Target Ranges:  Prepandial:   less than 140 mg/dL      Peak postprandial:   less than 180 mg/dL (1-2 hours)      Critically ill patients:  140 - 180 mg/dL   Lab Results  Component Value Date   GLUCAP 207 (H) 2017/01/15    Review of Glycemic Control  Results for BRAXSON, BRANDSMA (MRN [id number]) as of 01/15/17 11:01  Ref. Range 01/04/2017 16:27 01/04/2017 19:51 01/04/2017 23:44 January 15, 2017 04:12 2017-01-15 07:34  Glucose-Capillary Latest Ref Range: 65 - 99 mg/dL 222 (H) 254 (H) 229 (H) 230 (H) 207 (H)    Diabetes history: DM2 Outpatient Diabetes medications: Glyburide 10 mg BID, Novolog TID with meals  Current orders for Inpatient glycemic control: Novolog 0-15 units Q4H, Lantus 10 units qhs  Inpatient Diabetes Program Recommendations: If appropriate, consider increasing Lantus to 20 units qhs (0.2units/kg)  Gentry Fitz, RN, BA, [place], CDE Diabetes Coordinator Inpatient Diabetes Program  [phone] (Team Pager) [phone] ([place]) 01/15/2017 11:03 AM    I

## 2017-01-12 NOTE — Progress Notes (Signed)
[facility]  ROUNDING NOTE   Subjective:  Urine output was only 30 cc over the preceding 24 hours. Output from CRRT was 3.7 L. However he also had NG output of 2 L yesterday. He remains hypotensive this a.m.  Objective:  Vital signs in last 24 hours:  Temp:  [96.8 F (36 C)-98.1 F (36.7 C)] 96.8 F (36 C) (02/22 0900) Pulse Rate:  [99-147] 114 (02/22 0900) Resp:  [16-22] 20 (02/22 0900) BP: (68-123)/(50-91) 106/84 (02/22 0900) SpO2:  [92 %-100 %] 96 % (02/22 0900) FiO2 (%):  [30 %] 30 % (02/22 0434) Weight:  [99 kg (218 lb 4.1 oz)] 99 kg (218 lb 4.1 oz) (02/22 0300)  Weight change: -0.4 kg (-14.1 oz) Filed Weights   01/03/17 0449 01/04/17 0415 01/19/17 0300  Weight: 97.7 kg (215 lb 6.2 oz) 99.4 kg (219 lb 2.2 oz) 99 kg (218 lb 4.1 oz)    Intake/Output: I/O last 3 completed shifts: In: 5640.4 [I.V.:3230.4; Other:10; NG/GT:2330; IV Piggyback:70] Out: Isaiah.Mosses [Urine:58; Emesis/NG output:2000; Other:4616]   Intake/Output this shift:  Total I/O In: 173 [I.V.:173] Out: -   Physical Exam: General: Critically ill appearing  Head: Normocephalic, atraumatic. Moist oral mucosal membranes  Eyes: Icterus noted  Neck: Supple, trachea midline  Lungs:  Scattered rhonchi, vent assisted  Heart: S1S2 irregular tachycardic  Abdomen:  Soft, nontender, bowel sounds present  Extremities: 2+ peripheral edema.  Neurologic: Lethargic but eyes open, not following commands  Skin: No lesions, jaundice noted  Access: IJ permcath in place    Basic Metabolic Panel:  Recent Labs Lab 01/02/17 1216 01/02/17 1829  01/03/17 0542  01/03/17 1806  01/04/17 0351  01/04/17 1627 01/04/17 2100 01/19/2017 0030 Jan 19, 2017 0620 01-19-17 0821  NA  --  137  < > 140  < >  --   < > 138  < > 138 138 137 137 136  K  --  4.4  < > 4.0  < >  --   < > 4.3  < > 4.0 4.2 4.2 4.5 4.5  CL  --  106  < > 106  < >  --   < > 106  < > 104 105 104 102 103  CO2  --  21*  < > 25  < >  --   < > 27  < > _0 GLUCOSE  --  236*  < > 109*  < >  --   < > 184*  < > 246* 246* 247* 243* 219*  BUN  --  104*  < > 80*  < >  --   < > 58*  < > 52* 50* 48* 48* 48*  CREATININE  --  2.62*  < > 2.07*  < >  --   < > 1.73*  < > 1.55* 1.54* 1.41* 1.28* 1.30*  CALCIUM  --  7.7*  < > 7.7*  < >  --   < > 7.7*  < > 8.1* 8.0* 8.1* 8.1* 8.2*  MG 2.2 2.2  --  1.9  --  1.8  --  1.8  --   --   --   --   --   --   PHOS UNABLE TO REPORT DUE TO ICTERIC INTERFERENCE NOT VALID  < > UNABLE TO RESULT DUE TO ICTERUS  UNABLE TO REPORT DUE TO ICTERUS  < > UNABLE TO REPORT DUE TO ICTERUS JLJ  < > UNABLE TO REPORT DUE TO  ICTERUS  < > UNABLE TO REPORT DUE TO ICTERUS UNABLE TO REPORT DUE TO ICTERUS UNABLE TO REPORT DUE TO ICTERUS 3.6 1.1*  < > = values in this interval not displayed.  Liver Function Tests:  Recent Labs Lab 01/04/2017 1031 12/31/16 0402 01/01/17 0400 01/02/17 0416  01/03/17 1955  01/04/17 0351 01/04/17 0823  01/04/17 1627 01/04/17 2100 2017/02/02 0030 February 02, 2017 0620 2017-02-02 0821  AST 106* 51* 40 45*  --  68*  --   --  72*  --   --   --   --   --   --   ALT 497* 292* 211* 158*  --   --   --  109* 120*  --   --   --   --   --   --   ALKPHOS 71  --  60 61  --   --   --   --  70  --   --   --   --   --   --   BILITOT 18.9* 17.0* 18.4* 18.4*  --   --   --   --  18.8*  --   --   --   --   --   --   PROT 5.8*  --  5.1* 4.9*  --   --   --   --  4.7*  --   --   --   --   --   --   ALBUMIN 2.5*  --  1.8* 1.7*  < >  --   < > 1.6* 1.7*  1.7*  < > 1.7* 1.7* 1.8* 1.9* 1.8*  < > = values in this interval not displayed.  Recent Labs Lab 01/09/2017 1031 12/31/16 0402  LIPASE 18 19  AMYLASE  --  7*    Recent Labs Lab 01/04/17 0351  AMMONIA 12    CBC:  Recent Labs Lab 01/06/2017 1031 12/31/16 0402 01/01/17 0400 01/02/17 0416 01/03/17 0915 01/04/17 0351  WBC 1.1* 2.4* 4.3 3.1* 5.4 4.0  NEUTROABS 0.7*  --   --   --  5.0 3.8  HGB 9.9* 8.8* 9.8* 9.8* 8.3* 7.6*  HCT 28.2* 26.5* 28.3* 28.5* 23.6* 21.5*   MCV 93.8 95.9 92.9 94.2 90.3 90.6  PLT 91* 72* 88* 107* 82* 83*    Cardiac Enzymes:  Recent Labs Lab 12/26/2016 1031 01/03/2017 1842 12/31/16 0007 01/02/17 1829  CKTOTAL  --   --   --  17*  TROPONINI <0.03 <0.03 <0.03  --     BNP: Invalid input(s): POCBNP  CBG:  Recent Labs Lab 01/04/17 1627 01/04/17 1951 01/04/17 2344 Feb 02, 2017 0412 02/02/2017 0734  GLUCAP 222* 254* 229* 46* 64*    Microbiology: Results for orders placed or performed during the hospital encounter of 12/21/2016  Blood Culture (routine x 2)     Status: Abnormal   Collection Time: 12/18/2016 10:32 AM  Result Value Ref Range Status   Specimen Description BLOOD RIGHT FOREARM  Final   Special Requests BOTTLES DRAWN AEROBIC AND ANAEROBIC ANA9ML AER9ML  Final   Culture  Setup Time   Final    GRAM NEGATIVE RODS IN BOTH AEROBIC AND ANAEROBIC BOTTLES CRITICAL RESULT CALLED TO, READ BACK BY AND VERIFIED WITH: NATE COOKSON AT 0037 12/31/16.PMH Performed at [facility], [place] [address], [place],  [address]    Culture ESCHERICHIA COLI (A)  Final   Report Status 01/02/2017 FINAL  Final   Organism ID, Bacteria ESCHERICHIA COLI  Final      Susceptibility   Escherichia coli - MIC*    AMPICILLIN 8 SENSITIVE Sensitive     CEFAZOLIN <=4 SENSITIVE Sensitive     CEFEPIME <=1 SENSITIVE Sensitive     CEFTAZIDIME <=1 SENSITIVE Sensitive     CEFTRIAXONE <=1 SENSITIVE Sensitive     CIPROFLOXACIN <=0.25 SENSITIVE Sensitive     GENTAMICIN <=1 SENSITIVE Sensitive     IMIPENEM <=0.25 SENSITIVE Sensitive     TRIMETH/SULFA <=20 SENSITIVE Sensitive     AMPICILLIN/SULBACTAM 4 SENSITIVE Sensitive     PIP/TAZO <=4 SENSITIVE Sensitive     Extended ESBL NEGATIVE Sensitive     * ESCHERICHIA COLI  Culture, Urine     Status: Abnormal   Collection Time: 01/11/2017 10:32 AM  Result Value Ref Range Status   Specimen Description URINE, RANDOM  Final   Special Requests NONE  Final   Culture 50,000 COLONIES/mL ESCHERICHIA COLI  (A)  Final   Report Status 01/01/2017 FINAL  Final   Organism ID, Bacteria ESCHERICHIA COLI (A)  Final      Susceptibility   Escherichia coli - MIC*    AMPICILLIN <=2 SENSITIVE Sensitive     CEFAZOLIN <=4 SENSITIVE Sensitive     CEFTRIAXONE <=1 SENSITIVE Sensitive     CIPROFLOXACIN <=0.25 SENSITIVE Sensitive     GENTAMICIN <=1 SENSITIVE Sensitive     IMIPENEM <=0.25 SENSITIVE Sensitive     NITROFURANTOIN 32 SENSITIVE Sensitive     TRIMETH/SULFA <=20 SENSITIVE Sensitive     AMPICILLIN/SULBACTAM <=2 SENSITIVE Sensitive     PIP/TAZO <=4 SENSITIVE Sensitive     Extended ESBL NEGATIVE Sensitive     * 50,000 COLONIES/mL ESCHERICHIA COLI  Blood Culture ID Panel (Reflexed)     Status: Abnormal   Collection Time: 12/23/2016 10:32 AM  Result Value Ref Range Status   Enterococcus species NOT DETECTED NOT DETECTED Final   Listeria monocytogenes NOT DETECTED NOT DETECTED Final   Staphylococcus species NOT DETECTED NOT DETECTED Final   Staphylococcus aureus NOT DETECTED NOT DETECTED Final   Streptococcus species NOT DETECTED NOT DETECTED Final   Streptococcus agalactiae NOT DETECTED NOT DETECTED Final   Streptococcus pneumoniae NOT DETECTED NOT DETECTED Final   Streptococcus pyogenes NOT DETECTED NOT DETECTED Final   Acinetobacter baumannii NOT DETECTED NOT DETECTED Final   Enterobacteriaceae species DETECTED (A) NOT DETECTED Final    Comment: Enterobacteriaceae represent a large family of gram-negative bacteria, not a single organism. CRITICAL RESULT CALLED TO, READ BACK BY AND VERIFIED WITH: NATE COOKSON AT 0037 12/31/16.PMH    Enterobacter cloacae complex NOT DETECTED NOT DETECTED Final   Escherichia coli DETECTED (A) NOT DETECTED Final    Comment: CRITICAL RESULT CALLED TO, READ BACK BY AND VERIFIED WITH: NATE COOKSON AT 0037 12/31/16.PMH    Klebsiella oxytoca NOT DETECTED NOT DETECTED Final   Klebsiella pneumoniae NOT DETECTED NOT DETECTED Final   Proteus species NOT DETECTED NOT DETECTED  Final   Serratia marcescens NOT DETECTED NOT DETECTED Final   Carbapenem resistance NOT DETECTED NOT DETECTED Final   Haemophilus influenzae NOT DETECTED NOT DETECTED Final   Neisseria meningitidis NOT DETECTED NOT DETECTED Final   Pseudomonas aeruginosa NOT DETECTED NOT DETECTED Final   Candida albicans NOT DETECTED NOT DETECTED Final   Candida glabrata NOT DETECTED NOT DETECTED Final   Candida krusei NOT DETECTED NOT DETECTED Final   Candida parapsilosis NOT DETECTED NOT DETECTED Final   Candida tropicalis NOT DETECTED NOT DETECTED Final  Blood Culture (routine x 2)  Status: Abnormal   Collection Time: 12/28/2016 10:33 AM  Result Value Ref Range Status   Specimen Description BLOOD LEFT ARM  Final   Special Requests   Final    BOTTLES DRAWN AEROBIC AND ANAEROBIC ANA11ML AER13ML   Culture  Setup Time   Final    GRAM NEGATIVE RODS IN BOTH AEROBIC AND ANAEROBIC BOTTLES CRITICAL RESULT CALLED TO, READ BACK BY AND VERIFIED WITH: NATE COOKSON AT 0037 12/31/16.PMH    Culture (A)  Final    ESCHERICHIA COLI SUSCEPTIBILITIES PERFORMED ON PREVIOUS CULTURE WITHIN THE LAST 5 DAYS. Performed at [facility], [place] [address], [place],  [address]    Report Status 01/02/2017 FINAL  Final  MRSA PCR Screening     Status: Abnormal   Collection Time: 12/27/2016 10:26 PM  Result Value Ref Range Status   MRSA by PCR POSITIVE (A) NEGATIVE Final    Comment:        The GeneXpert MRSA Assay (FDA approved for NASAL specimens only), is one component of a comprehensive MRSA colonization surveillance program. It is not intended to diagnose MRSA infection nor to guide or monitor treatment for MRSA infections. RESULT CALLED TO, READ BACK BY AND VERIFIED WITH: LESLIE LEWIS AT 0014 12/31/16.PMH   CULTURE, BLOOD (ROUTINE X 2) w Reflex to ID Panel     Status: None (Preliminary result)   Collection Time: 01/03/17  9:14 AM  Result Value Ref Range Status   Specimen Description BLOOD R ARM  Final    Special Requests BOTTLES DRAWN AEROBIC AND ANAEROBIC BCAV  Final   Culture NO GROWTH 2 DAYS  Final   Report Status PENDING  Incomplete  CULTURE, BLOOD (ROUTINE X 2) w Reflex to ID Panel     Status: None (Preliminary result)   Collection Time: 01/03/17  9:19 AM  Result Value Ref Range Status   Specimen Description BLOOD R ARM  Final   Special Requests BOTTLES DRAWN AEROBIC AND ANAEROBIC BCAV  Final   Culture NO GROWTH 2 DAYS  Final   Report Status PENDING  Incomplete    Coagulation Studies:  Recent Labs  01/03/17 1229 01/04/17 0351  LABPROT 18.0* 18.8*  INR 1.47 1.56    Urinalysis: No results for input(s): COLORURINE, LABSPEC, PHURINE, GLUCOSEU, HGBUR, BILIRUBINUR, KETONESUR, PROTEINUR, UROBILINOGEN, NITRITE, LEUKOCYTESUR in the last 72 hours.  Invalid input(s): APPERANCEUR    Imaging: No results found.   Medications:   . amiodarone 60 mg/hr (2017-01-06 0535)  . feeding supplement (VITAL AF 1.2 CAL) Stopped (2017-01-06 0600)  . fentaNYL 400 mcg/hr (01-06-2017 0522)  . norepinephrine (LEVOPHED) Adult infusion 3 mcg/min (01/06/17 0229)  . phenylephrine (NEO-SYNEPHRINE) Adult infusion Stopped (01/02/17 1212)  . pureflow 1,500 mL (2017/01/06 0645)  . vasopressin (PITRESSIN) infusion - *FOR SHOCK* 0.03 Units/min (01/04/17 2300)   . cefTRIAXone (ROCEPHIN)  IV  2 g Intravenous Q24H  . chlorhexidine gluconate (MEDLINE KIT)  15 mL Mouth Rinse BID  . sennosides  5 mL Per Tube BID   And  . docusate  100 mg Per Tube BID  . famotidine  20 mg Per Tube QHS  . feeding supplement (PRO-STAT SUGAR FREE 64)  30 mL Oral Q4H  . fentaNYL (SUBLIMAZE) injection  50 mcg Intravenous Once  . hydrocortisone sod succinate (SOLU-CORTEF) inj  100 mg Intravenous Q8H  . insulin aspart  0-15 Units Subcutaneous Q4H  . insulin glargine  10 Units Subcutaneous Daily  . LORazepam  4 mg Intravenous Once  . mouth rinse  15 mL Mouth Rinse 10 times per day  . polyethylene glycol  17 g Oral Daily  . sodium  chloride flush  10-40 mL Intracatheter Q12H   sodium chloride, fentaNYL, heparin, midazolam  Assessment/ Plan:  71 y.o. male  with a PMHx of Atrial fibrillation, diabetes mellitus type 2, hypertension, myelodysplastic syndrome, congestive heart failure ejection fraction 30-35%, and melanoma, who was admitted to [facility] on 01/08/2017 for evaluation of altered mental status.   1.  Acute renal failure due to ATN from septic shock 2.  Septic shock, E. Coli in the blood 3.  Acute respiratory failure. 4.  Anemia unspecified, thrombocytopenia. 5.  Hepatic dysfunction. 6.  Hyperkalemia.   Plan:  Patient remains critically ill at this point in time. He only had 30 cc of urine output yesterday. However 5.7 L of output was noted yesterday. Patient has not shown any significant improvement. As such the family has decided to begin withdrawal of care which certainly appears reasonable given his multiple comorbidities and critically ill state now. As such we will also discontinue CRRT. Thanks for allowing [place] to per despite.   LOS: 6 Xavier King, MUNSOOR 03/15/20189:50 AM

## 2017-01-12 NOTE — Care Management (Signed)
Post demise: I have left message and faxed patient's hematologist Dr. Salome Arnt patient's Sabin and that patient has expired [phone] fax [phone]

## 2017-01-12 NOTE — Progress Notes (Signed)
[facility] Progess Note  Name: Xavier King MRN: [id number] DOB: Apr 14, 1946    ADMISSION DATE:  12/29/2016  SUBJECTIVE:  Pt currently on the ventilator, can not provide history or review of systems. Over the last 24 hours Had episode of desaturation and tachycardia after receiving a bath. Required brief increase in his FiO2 and subsequently weaned back down. Additional bolus of amiodarone given. Presently his ventricular responses 110-120   Remains critically ill, on multiple vasopressors Remains on CRRT and full vent support for severe resp failure   VITAL SIGNS: Temp:  [96.8 F (36 C)-98.1 F (36.7 C)] 96.8 F (36 C) (02/22 0900) Pulse Rate:  [99-147] 114 (02/22 0900) Resp:  [16-22] 20 (02/22 0900) BP: (68-123)/(50-91) 106/84 (02/22 0900) SpO2:  [92 %-100 %] 96 % (02/22 0900) FiO2 (%):  [30 %] 30 % (02/22 0434) Weight:  [218 lb 4.1 oz (99 kg)] 218 lb 4.1 oz (99 kg) (02/22 0300) HEMODYNAMICS:   VENTILATOR SETTINGS: Vent Mode: PRVC FiO2 (%):  [30 %] 30 % Set Rate:  [20 bmp] 20 bmp Vt Set:  [500 mL] 500 mL PEEP:  [5 cmH20] 5 cmH20 INTAKE / OUTPUT:  Intake/Output Summary (Last 24 hours) at 02/04/2017 0932 Last data filed at 02/04/2017 0900  Gross per 24 hour  Intake          3497.44 ml  Output             5508 ml  Net         -2010.56 ml    Physical Examination:   VS: BP 93/76   Pulse (!) 111   Temp 97 F (36.1 C) (Core (Comment)) Comment (Src): foley  Resp 20   Ht 6\' 1"  (1.854 m)   Wt 215 lb 6.2 oz (97.7 kg)   SpO2 99%   BMI 28.42 kg/m   General Appearance: +distress Presently on fentanyl and propofol for sedation, icteric sclera Neuro: Limited exam secondary to sedative agents HEENT: his pupils appear equal and reactive today. Pulmonary: Coarse rhonchi in pleural rub appreciated bilaterally. CardiovascularNormal S1,S2.  No m/r/g.   Abdomen: Benign, Soft, non-tender. Endocrine: No evident thyromegaly. Skin:   warm, no rashes, no  ecchymosis  Extremities: normal, no cyanosis, clubbing.   LABS: Chest x-ray reveals prominent cardiac silhouette with right infrahilar parenchymal infiltrates.   LABORATORY PANEL:   CBC  Recent Labs Lab 01/04/17 0351  WBC 4.0  HGB 7.6*  HCT 21.5*  PLT 83*    Chemistries   Recent Labs Lab 01/04/17 0351 01/04/17 0823  2017-02-04 0821  NA 138 138  < > 136  K 4.3 5.4*  < > 4.5  CL 106 104  < > 103  CO2 27 28  < > 27  GLUCOSE 184* 183*  < > 219*  BUN 58* 57*  < > 48*  CREATININE 1.73* 0.85  < > 1.30*  CALCIUM 7.7* 7.9*  < > 8.2*  MG 1.8  --   --   --   PHOS UNABLE TO REPORT DUE TO ICTERUS UNABLE TO REPORT DUE TO ICTERUS SDR  < > 1.1*  AST  --  72*  --   --   ALT 109* 120*  --   --   ALKPHOS  --  70  --   --   BILITOT  --  18.8*  --   --   < > = values in this interval not displayed.   Recent Labs Lab 01/04/17 1216 01/04/17 1627  01/04/17 1951 01/04/17 2344 01/18/2017 0412 Jan 18, 2017 0734  GLUCAP 189* 222* 254* 229* 230* 207*    Recent Labs Lab 12/31/16 0500 01/02/17 0835 01/03/17 1040  PHART 7.34* 7.29* 7.41  PCO2ART 39 40 40  PO2ART 120* 63* 77*    Recent Labs Lab 01/01/17 0400 01/02/17 0416  01/03/17 1955  01/04/17 0351 01/04/17 0823  01/18/2017 0030 2017-01-18 0620 01-18-2017 0821  AST 40 45*  --  68*  --   --  72*  --   --   --   --   ALT 211* 158*  --   --   --  109* 120*  --   --   --   --   ALKPHOS 60 61  --   --   --   --  70  --   --   --   --   BILITOT 18.4* 18.4*  --   --   --   --  18.8*  --   --   --   --   ALBUMIN 1.8* 1.7*  < >  --   < > 1.6* 1.7*  1.7*  < > 1.8* 1.9* 1.8*  < > = values in this interval not displayed.  Cardiac Enzymes  Recent Labs Lab 12/31/16 0007  TROPONINI <0.03      ASSESSMENT/PLAN   71 yo white male with severe resp failure from severe septic shock with multiorgan failure from gram neg bacteremia with e.coli with liver and renal failure on MV  support  1.Respiratory Failure -continue Full MV  support -continue Bronchodilator Therapy -Wean Fio2 and PEEP as tolerated  2.Septic shock: Blood cultures positive for gram-negative rods-E.coli -empiric hydrocortisone.  -currently on vasopressin, norepinephrine. -Weaning as tolerated first weaning the norepinephrine  -will repeat blood cultures   3.Atrial fibrillation: Rapid atrial fibrillation with a ventricular response approximately 100-110. Presently on amiodarone infusion, appreciate cardiology's assistance and input, will continue amiodarone rate increased last night aftre re-bolus of amiodarone  HR remains in 130's  Cardiomyopathy: History of cardiomyopathy with ejection fraction of 30-35%  4.Pancytopenia: Underlying history of mild dysplastic syndrome. We'll hold transfusion of blood until his hemoglobin gets less than 7 and platelets less than 20 or any evidence of active bleeding.  5.Obstructive jaundice: Total bilirubin has decreased to 18.4, no evidence of obstruction on the right upper quadrant ultrasound, fatty liver noted.  6.severe Renal failure:  Patient started on CRRT yesterday   Critical Care Time devoted to patient care services described in this note is 32 minutes.   Overall, patient is critically ill, prognosis is guarded.  Patient with Multiorgan failure and at high risk for cardiac arrest and death.  Family updated yesterday, will update again today-patient was made DNR   Will address comfort care measures today  Corrin Parker, M.D.  [facility]  Medical Director [place] Director [facility] Cardio-Pulmonary Department

## 2017-01-12 NOTE — Death Summary Note (Signed)
DEATH SUMMARY   Patient Details  Name: Xavier King MRN: [id number] DOB: 02-25-1946  Admission/Discharge Information   Admit Date:  January 27, 2017  Date of Death: Date of Death: 2017-02-02  Time of Death: Time of Death: 56  Length of Stay: 2023/02/15  Referring Physician: No PCP Per Patient   Reason(s) for Hospitalization  Septic shock with multiorgan failure  Diagnoses  Preliminary cause of death:  Secondary Diagnoses (including complications and co-morbidities):  Active Problems:   Acute respiratory failure with hypoxia (HCC)   Severe sepsis (HCC)   Acute renal failure (HCC)   Chronic atrial fibrillation (HCC)   Elevated bilirubin   Bacteremia due to Escherichia coli   Pressure injury of skin      Pertinent Labs and Studies  Significant Diagnostic Studies Dg Abdomen 1 View  Result Date: 2017/01/27 CLINICAL DATA:  Status post OG tube placement EXAM: ABDOMEN - 1 VIEW COMPARISON:  None. FINDINGS: Gastric catheter lies at the level of the gastroesophageal junction. Proximal side port is within the distal esophagus. This should be advanced as clinically indicated. Left renal calculus is noted. No obstructive changes are seen. IMPRESSION: Gastric catheter at the GE junction as described. Left renal calculus. Electronically Signed   By: Inez Catalina M.D.   On: 01/27/2017 11:41   Ct Head Wo Contrast  Result Date: 01/27/17 CLINICAL DATA:  Episode of confusion since Wednesday, history myelodysplastic syndrome, atrial fibrillation, hypertension, diabetes mellitus, prior melanoma EXAM: CT HEAD WITHOUT CONTRAST TECHNIQUE: Contiguous axial images were obtained from the base of the skull through the vertex without intravenous contrast. Sagittal and coronal MPR images reconstructed from axial data set. COMPARISON:  None FINDINGS: Brain: Generalized atrophy. Normal ventricular morphology. No midline shift or mass effect. Minimal small vessel chronic ischemic changes of deep cerebral white  matter. No intracranial hemorrhage, mass lesion, evidence of acute infarction, or extra-axial fluid collection. Vascular: Atherosclerotic calcifications at the carotid arteries and vertebral Skull: Demineralized but intact Sinuses/Orbits: Mucosal thickening in ethmoid air cells. Remaining paranasal sinuses and mastoid air cells clear. Other: N/A IMPRESSION: Atrophy with minimal small vessel chronic ischemic changes of deep cerebral white matter. No acute intracranial abnormalities. Electronically Signed   By: Lavonia Dana M.D.   On: 01/27/17 12:17   US Renal  Result Date: 12/31/2016 CLINICAL DATA:  Acute renal failure EXAM: RENAL / URINARY TRACT ULTRASOUND COMPLETE COMPARISON:  None. FINDINGS: Right Kidney: Length: 13.6 cm. No hydronephrosis. 4 mm nonobstructing interpolar stone. 12 mm interpolar cortical cyst. Left Kidney: Length: 13.6 cm. 5 mm nonobstructing interpolar stone. No hydronephrosis. No gross mass lesion. Bladder: Decompressed by Foley catheter. Note:  Small volume ascites evident. IMPRESSION: No hydronephrosis. Electronically Signed   By: Misty Stanley M.D.   On: 12/31/2016 17:14   Dg Chest Port 1 View  Result Date: 01/02/2017 CLINICAL DATA:  71 y/o  M; central line placement. EXAM: PORTABLE CHEST 1 VIEW COMPARISON:  01/01/2017 chest radiograph FINDINGS: Endotracheal tube is 3 cm from the carina. Enteric tube tip projects over the mid stomach. Left central venous catheter tip projects over cavoatrial junction. Low lung volumes. Elevated right hemidiaphragm. Bibasilar opacities probably representing effusion and atelectasis. No pneumothorax. Stable cardiomegaly. Aortic atherosclerosis with calcification. IMPRESSION: Left central line tip projects over the cavoatrial junction. Stable bibasilar opacities probably representing effusions and atelectasis. Electronically Signed   By: Kristine Garbe M.D.   On: 01/02/2017 15:49   Dg Chest Port 1 View  Result Date: 01/01/2017 CLINICAL  DATA:  Acute respiratory failure EXAM:  PORTABLE CHEST 1 VIEW COMPARISON:  12/31/2016 FINDINGS: Endotracheal tube with tip measuring 4.5 cm above the carina. Enteric tube tip is off the field of view but below the left hemidiaphragm. Mild cardiac enlargement without vascular congestion. Bilateral basilar infiltrates, greater on the right. Probable small pleural effusions. No pneumothorax. No change since prior study. IMPRESSION: Appliances appear in satisfactory location. Bilateral basilar infiltrates and effusions, greater on the right. No significant change. Electronically Signed   By: Lucienne Capers M.D.   On: 01/01/2017 05:35   Dg Chest Port 1 View  Result Date: 12/31/2016 CLINICAL DATA:  Initial evaluation for acute respiratory failure. EXAM: PORTABLE CHEST 1 VIEW COMPARISON:  Prior radiograph from 12/15/2016. FINDINGS: Patient remains intubated with the tip of the endotracheal tube located at the upper margin of the clavicles. Enteric tube overlies the stomach. Stable cardiomegaly. Mediastinal silhouette within normal limits. Aortic atherosclerosis. Persistent hazy and patchy bibasilar opacities, worsened on the left, which may reflect atelectasis and/ or infiltrates. Perihilar vascular congestion without overt pulmonary edema. Suspected small bilateral pleural effusions. No pneumothorax. No acute osseous abnormality. IMPRESSION: 1. Support apparatus in satisfactory position. 2. Persistent hazy and patchy bibasilar opacities, worsened on the left. Findings may reflect atelectasis and/or infiltrates. 3. Suspected small bilateral pleural effusions. Electronically Signed   By: Jeannine Boga M.D.   On: 12/31/2016 03:57   Dg Chest Port 1 View  Result Date: 01/08/2017 CLINICAL DATA:  Right side groin central line placement EXAM: PORTABLE CHEST 1 VIEW COMPARISON:  12/17/2016 FINDINGS: Cardiomegaly is noted. Stable endotracheal and NG tube position. There is hazy atelectasis or infiltrate in right  base. Aspiration cannot be excluded. No central line is identified. There is no pneumothorax. IMPRESSION: Endotracheal tube and NG tube in place. Hazy atelectasis or infiltrate in right lower lobe. Aspiration cannot be excluded. No pneumothorax. No central line is identified. Electronically Signed   By: Lahoma Crocker M.D.   On: 12/28/2016 16:52   Dg Chest Port 1 View  Result Date: 01/08/2017 CLINICAL DATA:  Intubated.  Cough. EXAM: PORTABLE CHEST 1 VIEW COMPARISON:  12/29/2016 FINDINGS: There is an endotracheal tube with the tip 5.8 cm above the carina. There is no pleural effusion or pneumothorax. There is no focal consolidation. There is mild cardiomegaly. There is a nasogastric tube coursing below the diaphragm. The osseous structures are unremarkable. IMPRESSION: Endotracheal tube with the tip 5.8 cm above the carina. Electronically Signed   By: Kathreen Devoid   On: 12/18/2016 11:38   US Abdomen Limited Ruq  Result Date: 12/27/2016 CLINICAL DATA:  Elevated bilirubin. EXAM: US ABDOMEN LIMITED - RIGHT UPPER QUADRANT COMPARISON:  None. FINDINGS: Gallbladder: The stone near the gallbladder neck. Dependent gallbladder sludge. No wall thickening. Common bile duct: Diameter: 3.6 mm Liver: Increased parenchymal echogenicity. Normal liver size. No liver mass or focal lesion. Trace amount ascites noted adjacent to the liver underneath the right hemidiaphragm. Small right pleural effusion. IMPRESSION: 1. No intra or extrahepatic bile duct dilation. No evidence of biliary obstruction. 2. Small gallstone in gallbladder sludge, but no sonographic evidence of acute cholecystitis. 3. Increased parenchymal echogenicity of the liver consistent with hepatic steatosis. 4. Trace amount of ascites.  Small right pleural effusion. Electronically Signed   By: Lajean Manes M.D.   On: 01/03/2017 13:42    Microbiology Recent Results (from the past 240 hour(s))  Blood Culture (routine x 2)     Status: Abnormal   Collection Time:  12/15/2016 10:32 AM  Result Value Ref Range Status  Specimen Description BLOOD RIGHT FOREARM  Final   Special Requests BOTTLES DRAWN AEROBIC AND ANAEROBIC ANA9ML AER9ML  Final   Culture  Setup Time   Final    GRAM NEGATIVE RODS IN BOTH AEROBIC AND ANAEROBIC BOTTLES CRITICAL RESULT CALLED TO, READ BACK BY AND VERIFIED WITH: NATE COOKSON AT 0037 12/31/16.PMH Performed at [facility], [place] [address] [place], [place] [address]    Culture ESCHERICHIA COLI (A)  Final   Report Status 01/02/2017 FINAL  Final   Organism ID, Bacteria ESCHERICHIA COLI  Final      Susceptibility   Escherichia coli - MIC*    AMPICILLIN 8 SENSITIVE Sensitive     CEFAZOLIN <=4 SENSITIVE Sensitive     CEFEPIME <=1 SENSITIVE Sensitive     CEFTAZIDIME <=1 SENSITIVE Sensitive     CEFTRIAXONE <=1 SENSITIVE Sensitive     CIPROFLOXACIN <=0.25 SENSITIVE Sensitive     GENTAMICIN <=1 SENSITIVE Sensitive     IMIPENEM <=0.25 SENSITIVE Sensitive     TRIMETH/SULFA <=20 SENSITIVE Sensitive     AMPICILLIN/SULBACTAM 4 SENSITIVE Sensitive     PIP/TAZO <=4 SENSITIVE Sensitive     Extended ESBL NEGATIVE Sensitive     * ESCHERICHIA COLI  Culture, Urine     Status: Abnormal   Collection Time: 01/07/2017 10:32 AM  Result Value Ref Range Status   Specimen Description URINE, RANDOM  Final   Special Requests NONE  Final   Culture 50,000 COLONIES/mL ESCHERICHIA COLI (A)  Final   Report Status 01/01/2017 FINAL  Final   Organism ID, Bacteria ESCHERICHIA COLI (A)  Final      Susceptibility   Escherichia coli - MIC*    AMPICILLIN <=2 SENSITIVE Sensitive     CEFAZOLIN <=4 SENSITIVE Sensitive     CEFTRIAXONE <=1 SENSITIVE Sensitive     CIPROFLOXACIN <=0.25 SENSITIVE Sensitive     GENTAMICIN <=1 SENSITIVE Sensitive     IMIPENEM <=0.25 SENSITIVE Sensitive     NITROFURANTOIN 32 SENSITIVE Sensitive     TRIMETH/SULFA <=20 SENSITIVE Sensitive     AMPICILLIN/SULBACTAM <=2 SENSITIVE Sensitive     PIP/TAZO <=4 SENSITIVE Sensitive      Extended ESBL NEGATIVE Sensitive     * 50,000 COLONIES/mL ESCHERICHIA COLI  Blood Culture ID Panel (Reflexed)     Status: Abnormal   Collection Time: 01/03/2017 10:32 AM  Result Value Ref Range Status   Enterococcus species NOT DETECTED NOT DETECTED Final   Listeria monocytogenes NOT DETECTED NOT DETECTED Final   Staphylococcus species NOT DETECTED NOT DETECTED Final   Staphylococcus aureus NOT DETECTED NOT DETECTED Final   Streptococcus species NOT DETECTED NOT DETECTED Final   Streptococcus agalactiae NOT DETECTED NOT DETECTED Final   Streptococcus pneumoniae NOT DETECTED NOT DETECTED Final   Streptococcus pyogenes NOT DETECTED NOT DETECTED Final   Acinetobacter baumannii NOT DETECTED NOT DETECTED Final   Enterobacteriaceae species DETECTED (A) NOT DETECTED Final    Comment: Enterobacteriaceae represent a large family of gram-negative bacteria, not a single organism. CRITICAL RESULT CALLED TO, READ BACK BY AND VERIFIED WITH: NATE COOKSON AT 0037 12/31/16.PMH    Enterobacter cloacae complex NOT DETECTED NOT DETECTED Final   Escherichia coli DETECTED (A) NOT DETECTED Final    Comment: CRITICAL RESULT CALLED TO, READ BACK BY AND VERIFIED WITH: NATE COOKSON AT 0037 12/31/16.PMH    Klebsiella oxytoca NOT DETECTED NOT DETECTED Final   Klebsiella pneumoniae NOT DETECTED NOT DETECTED Final   Proteus species NOT DETECTED NOT DETECTED Final   Serratia marcescens  NOT DETECTED NOT DETECTED Final   Carbapenem resistance NOT DETECTED NOT DETECTED Final   Haemophilus influenzae NOT DETECTED NOT DETECTED Final   Neisseria meningitidis NOT DETECTED NOT DETECTED Final   Pseudomonas aeruginosa NOT DETECTED NOT DETECTED Final   Candida albicans NOT DETECTED NOT DETECTED Final   Candida glabrata NOT DETECTED NOT DETECTED Final   Candida krusei NOT DETECTED NOT DETECTED Final   Candida parapsilosis NOT DETECTED NOT DETECTED Final   Candida tropicalis NOT DETECTED NOT DETECTED Final  Blood Culture  (routine x 2)     Status: Abnormal   Collection Time: 01/07/2017 10:33 AM  Result Value Ref Range Status   Specimen Description BLOOD LEFT ARM  Final   Special Requests   Final    BOTTLES DRAWN AEROBIC AND ANAEROBIC ANA11ML AER13ML   Culture  Setup Time   Final    GRAM NEGATIVE RODS IN BOTH AEROBIC AND ANAEROBIC BOTTLES CRITICAL RESULT CALLED TO, READ BACK BY AND VERIFIED WITH: NATE COOKSON AT 0037 12/31/16.PMH    Culture (A)  Final    ESCHERICHIA COLI SUSCEPTIBILITIES PERFORMED ON PREVIOUS CULTURE WITHIN THE LAST 5 DAYS. Performed at [facility], [place] [address], [place], [place] [address]    Report Status 01/02/2017 FINAL  Final  MRSA PCR Screening     Status: Abnormal   Collection Time: 12/22/2016 10:26 PM  Result Value Ref Range Status   MRSA by PCR POSITIVE (A) NEGATIVE Final    Comment:        The GeneXpert MRSA Assay (FDA approved for NASAL specimens only), is one component of a comprehensive MRSA colonization surveillance program. It is not intended to diagnose MRSA infection nor to guide or monitor treatment for MRSA infections. RESULT CALLED TO, READ BACK BY AND VERIFIED WITH: LESLIE LEWIS AT 0014 12/31/16.PMH   CULTURE, BLOOD (ROUTINE X 2) w Reflex to ID Panel     Status: None (Preliminary result)   Collection Time: 01/03/17  9:14 AM  Result Value Ref Range Status   Specimen Description BLOOD R ARM  Final   Special Requests BOTTLES DRAWN AEROBIC AND ANAEROBIC BCAV  Final   Culture NO GROWTH 3 DAYS  Final   Report Status PENDING  Incomplete  CULTURE, BLOOD (ROUTINE X 2) w Reflex to ID Panel     Status: None (Preliminary result)   Collection Time: 01/03/17  9:19 AM  Result Value Ref Range Status   Specimen Description BLOOD R ARM  Final   Special Requests BOTTLES DRAWN AEROBIC AND ANAEROBIC BCAV  Final   Culture NO GROWTH 3 DAYS  Final   Report Status PENDING  Incomplete    Lab Basic Metabolic Panel:  Recent Labs Lab 01/02/17 1216 01/02/17 1829   01/03/17 0542  01/03/17 1806  01/04/17 0351  01/04/17 1627 01/04/17 2100 Jan 06, 2017 0030 2017-01-06 0620 01/06/2017 0821  NA  --  137  < > 140  < >  --   < > 138  < > 138 138 137 137 136  K  --  4.4  < > 4.0  < >  --   < > 4.3  < > 4.0 4.2 4.2 4.5 4.5  CL  --  106  < > 106  < >  --   < > 106  < > 104 105 104 102 103  CO2  --  21*  < > 25  < >  --   < > 27  < > 27 26 27  27  27  GLUCOSE  --  236*  < > 109*  < >  --   < > 184*  < > 246* 246* 247* 243* 219*  BUN  --  104*  < > 80*  < >  --   < > 58*  < > 52* 50* 48* 48* 48*  CREATININE  --  2.62*  < > 2.07*  < >  --   < > 1.73*  < > 1.55* 1.54* 1.41* 1.28* 1.30*  CALCIUM  --  7.7*  < > 7.7*  < >  --   < > 7.7*  < > 8.1* 8.0* 8.1* 8.1* 8.2*  MG 2.2 2.2  --  1.9  --  1.8  --  1.8  --   --   --   --   --   --   PHOS UNABLE TO REPORT DUE TO ICTERIC INTERFERENCE NOT VALID  < > UNABLE TO RESULT DUE TO ICTERUS  UNABLE TO REPORT DUE TO ICTERUS  < > UNABLE TO REPORT DUE TO ICTERUS JLJ  < > UNABLE TO REPORT DUE TO ICTERUS  < > UNABLE TO REPORT DUE TO ICTERUS UNABLE TO REPORT DUE TO ICTERUS UNABLE TO REPORT DUE TO ICTERUS 3.6 1.1*  < > = values in this interval not displayed. Liver Function Tests:  Recent Labs Lab 12/31/16 0402  01/01/17 0400 01/02/17 0416  01/03/17 1955  01/04/17 0351 01/04/17 0823  01/04/17 1627 01/04/17 2100 2017/01/15 0030 15-Jan-2017 0620 2017-01-15 0821  AST 51*  --  40 45*  --  68*  --   --  72*  --   --   --   --   --   --   ALT 292*  --  211* 158*  --   --   --  109* 120*  --   --   --   --   --   --   ALKPHOS  --   --  60 61  --   --   --   --  70  --   --   --   --   --   --   BILITOT 17.0*  --  18.4* 18.4*  --   --   --   --  18.8*  --   --   --   --   --   --   PROT  --   --  5.1* 4.9*  --   --   --   --  4.7*  --   --   --   --   --   --   ALBUMIN  --   < > 1.8* 1.7*  < >  --   < > 1.6* 1.7*  1.7*  < > 1.7* 1.7* 1.8* 1.9* 1.8*  < > = values in this interval not displayed.  Recent Labs Lab 12/31/16 0402  LIPASE 19   AMYLASE 7*    Recent Labs Lab 01/04/17 0351  AMMONIA 12   CBC:  Recent Labs Lab 12/31/16 0402 01/01/17 0400 01/02/17 0416 01/03/17 0915 01/04/17 0351  WBC 2.4* 4.3 3.1* 5.4 4.0  NEUTROABS  --   --   --  5.0 3.8  HGB 8.8* 9.8* 9.8* 8.3* 7.6*  HCT 26.5* 28.3* 28.5* 23.6* 21.5*  MCV 95.9 92.9 94.2 90.3 90.6  PLT 72* 88* 107* 82* 83*   Cardiac Enzymes:  Recent Labs Lab 01/04/2017 1842 12/31/16 0007 01/02/17 1829  CKTOTAL  --   --  17*  TROPONINI <0.03 <0.03  --    Sepsis Labs:  Recent Labs Lab 12/23/2016 1842  12/31/16 0402 12/31/16 1726 01/01/17 0400 01/01/17 2323 01/02/17 0416 01/03/17 0915 01/04/17 0351  PROCALCITON 7.35  --  12.56  --  14.08  --   --   --   --   WBC  --   < > 2.4*  --  4.3  --  3.1* 5.4 4.0  LATICACIDVEN  --   < >  --  2.2* 2.7* 1.9 2.1*  --   --   < > = values in this interval not displayed.  Procedures/Operations  71 yo white male with severe resp failure from severe septic shock with multiorgan failure from gram neg bacteremia with e.coli with liver and renal failure on MV  support  1.Respiratory Failure -continue Full MV support -continue Bronchodilator Therapy -Wean Fio2 and PEEP as tolerated  2.Septic shock: Blood cultures positive for gram-negative rods-E.coli -empiric hydrocortisone.  -currently on vasopressin, norepinephrine. -Weaning as tolerated first weaning the norepinephrine  -will repeat blood cultures   3.Atrial fibrillation: Rapid atrial fibrillation with a ventricular response approximately 100-110. Presently on amiodarone infusion, appreciate cardiology's assistance and input, will continue amiodarone rate increased last night aftre re-bolus of amiodarone  HR remains in 130's  Patient underwent full v ent support, vasopressors support and CRRT support  Korrina Zern 01/06/2017, 11:39 AM

## 2017-01-12 NOTE — Progress Notes (Signed)
[place] received a page to provide emotion support for the family of a Pt in [place] who was being extubated. [place] met the family while the Medical team was extubating the Pt, and provided emotional support and presence. Pt is under "comfort care," but appeared to be actively dying. Ava informed the Nurse that he would be available if needed. [place] to make a follow up with that family when the time is right.     2017-02-02 1200  Clinical Encounter Type  Visited With Patient;Patient and family together  Visit Type Initial;Psychological support;Spiritual support;Patient actively dying  Referral From Nurse  Consult/Referral To [place] (Comment)

## 2017-01-12 NOTE — Progress Notes (Signed)
Asystole per cardiac monitor at 1755.  This RN and Kathyrn Drown, RN went in room to assess and auscultated for heart tones for a full minute each and no heart tones heard.  Time of cardiac death pronounced at 1757.  Patient's wife and sister at bedside.  Emotional support offered to both wife and sister by this RN and Clyde Canterbury, Therapist, sports.  RN notified Webb Silversmith, RN nursing supervisor and Dr. Ashby Dawes of patient's time of death of 35.  RN also called [facility] and spoke with April Shore and made her aware of patient's cardiac time of death. Patient is not a potential donor.  Chaplain present in the ICU and available for support for family.

## 2017-01-12 NOTE — Progress Notes (Signed)
Pt remains on CRRT tolerating well. Pt will open eyes spontaneously, but have not followed verbal command. Pt vomited tube feed last night orders received place OG to suction 2011ml was removed then clamped Tube feeding ion hold. Pt UOP 54ml this shift.

## 2017-01-12 DEATH — deceased

## 2017-03-05 IMAGING — US US RENAL
1 series · 14 of 25 positions shown · non-contrast
Comparison: None.

CLINICAL DATA: Acute renal failure

EXAM:
RENAL / URINARY TRACT ULTRASOUND COMPLETE

[Series 1: us renal · 0.25mm/px · 14 of 60 slices shown]
[im 1/60]
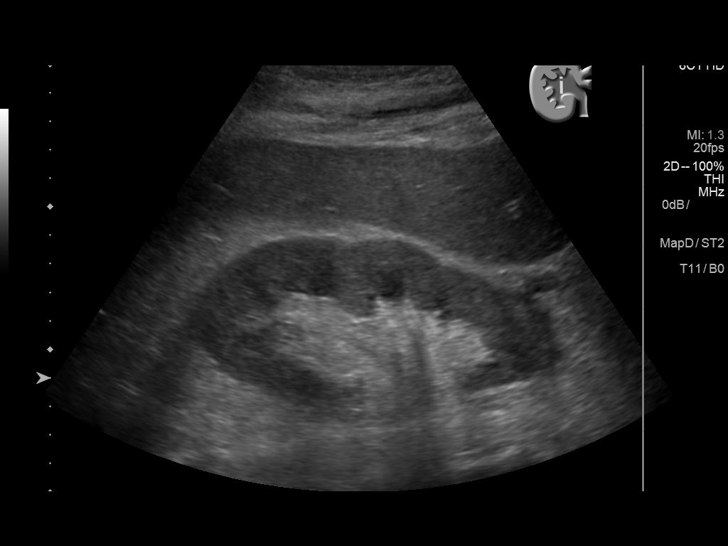
[im 5/60]
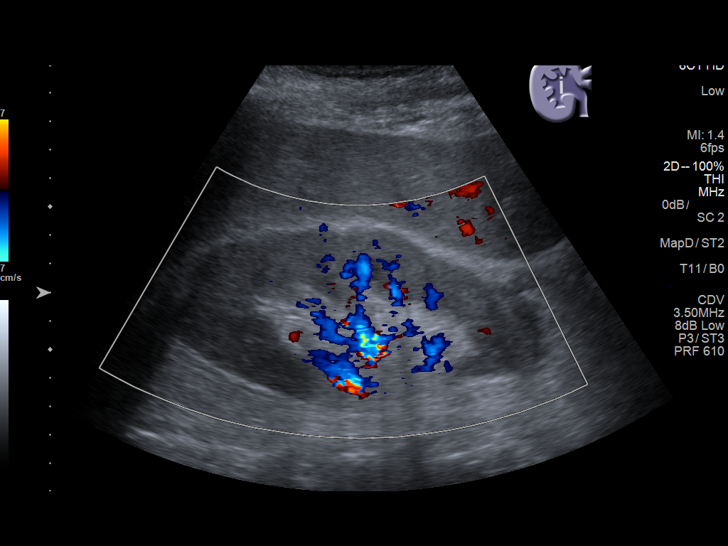
[im 10/60]
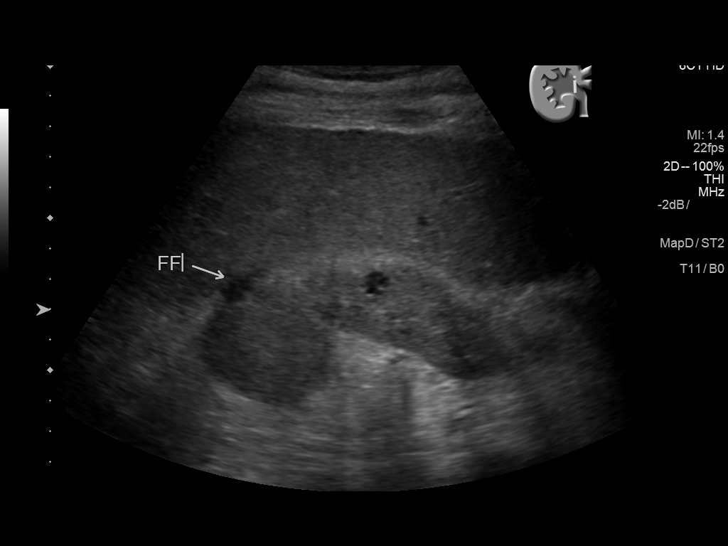
[im 15/60]
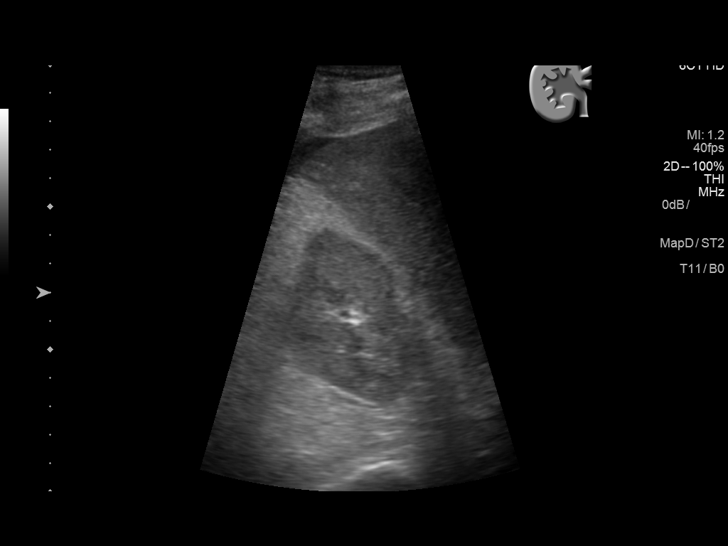
[im 20/60]
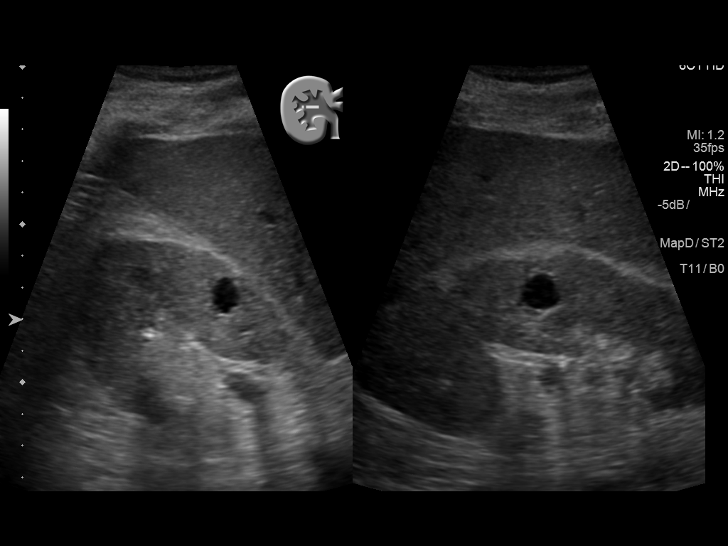
[im 23/60]
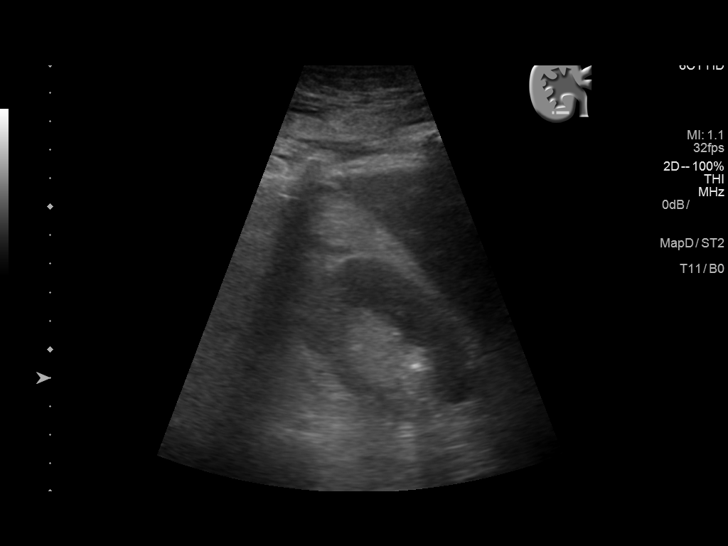
[im 28/60]
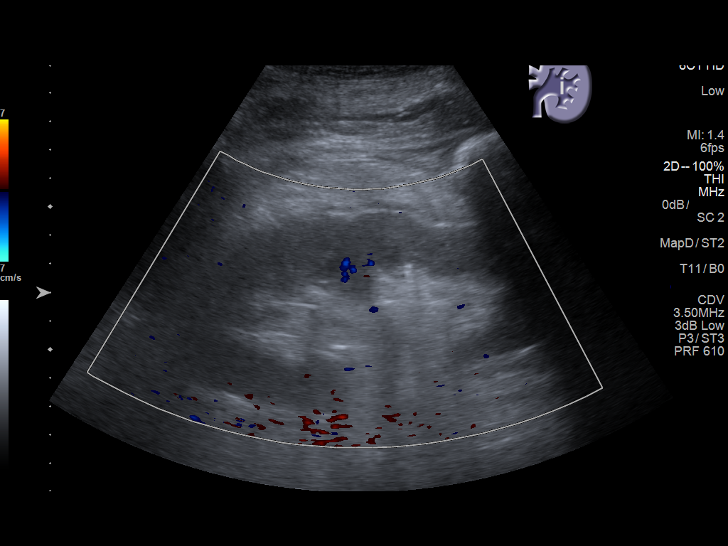
[im 32/60]
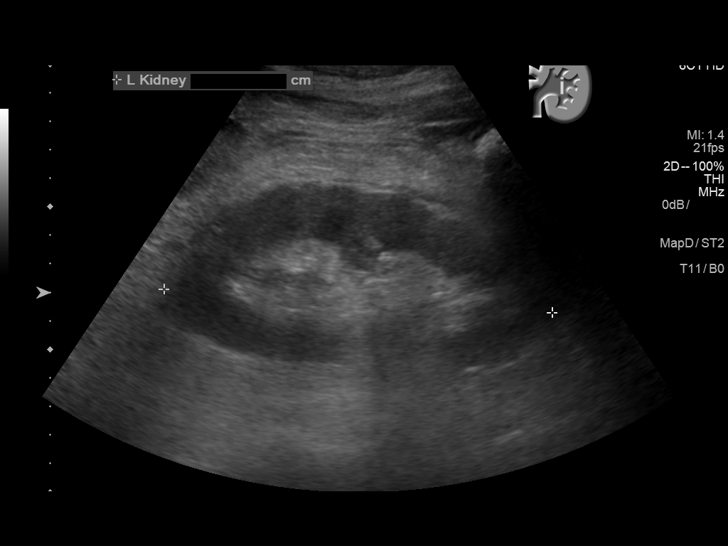
[im 37/60]
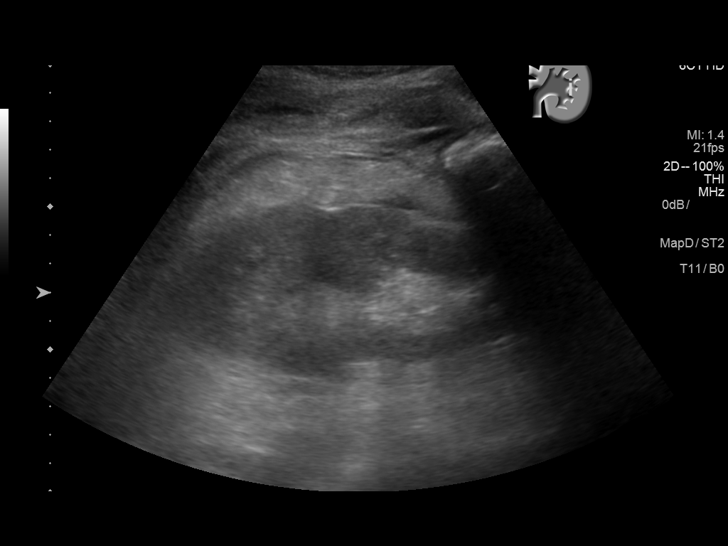
[im 40/60]
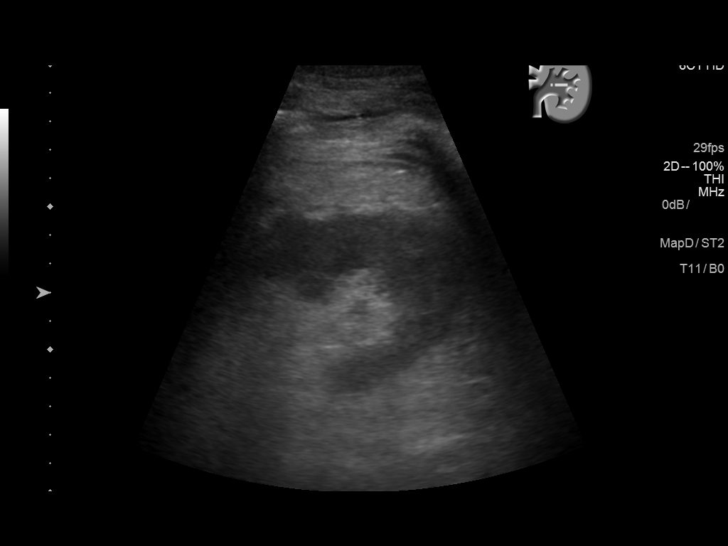
[im 45/60]
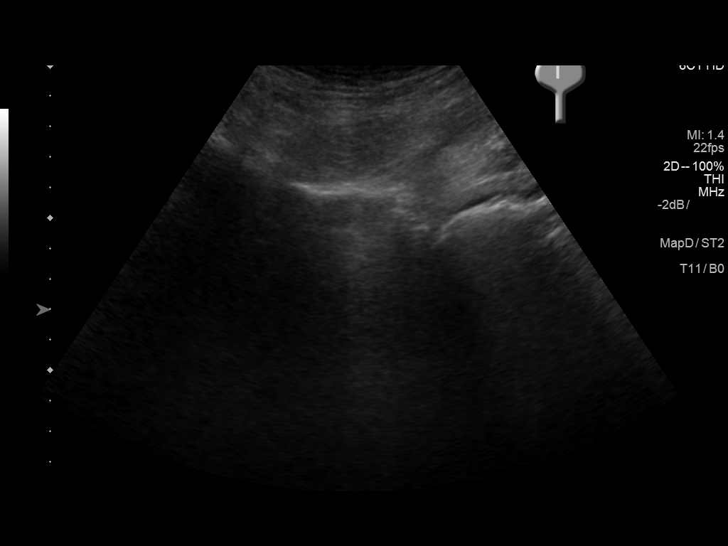
[im 50/60]
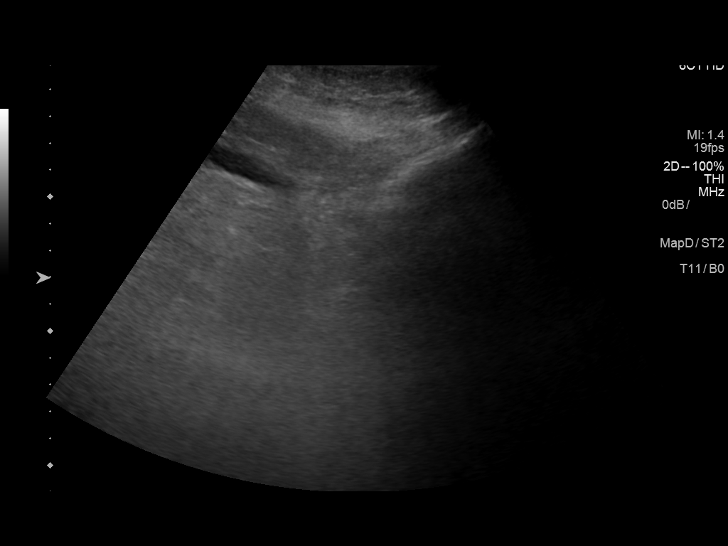
[im 55/60]
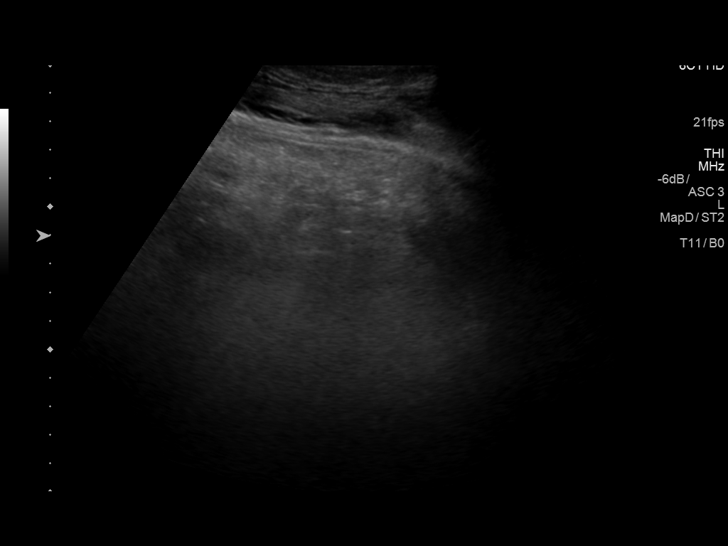
[im 60/60]
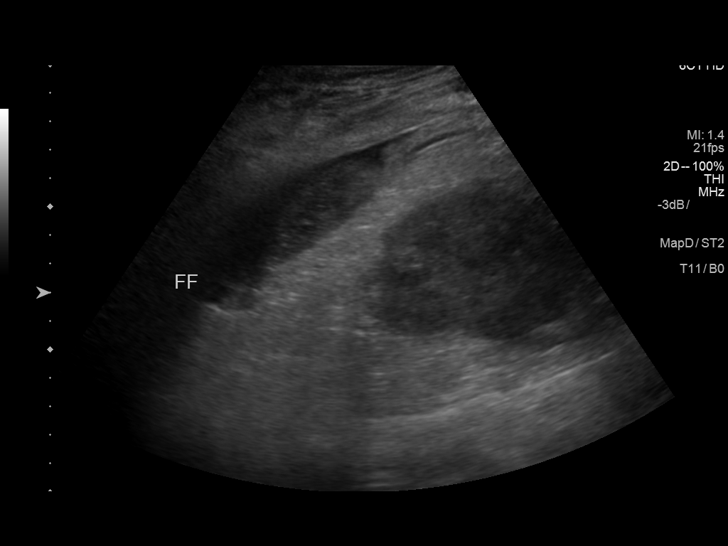

[14 of 25 positions shown; findings below may reference images not displayed]

FINDINGS: Right Kidney:

Length: 13.6 cm. No hydronephrosis. 4 mm nonobstructing interpolar
stone. 12 mm interpolar cortical cyst.

Left Kidney:

Length: 13.6 cm. 5 mm nonobstructing interpolar stone. No
hydronephrosis. No gross mass lesion.

Bladder:

Decompressed by Foley catheter.

Note:  Small volume ascites evident.
IMPRESSION: No hydronephrosis.

## 2018-01-15 IMAGING — DX DG ABDOMEN 1V
1 series · 1 of 1 positions shown · non-contrast
Comparison: None.

CLINICAL DATA: Status post OG tube placement

EXAM:
ABDOMEN - 1 VIEW

[abdomen kub]
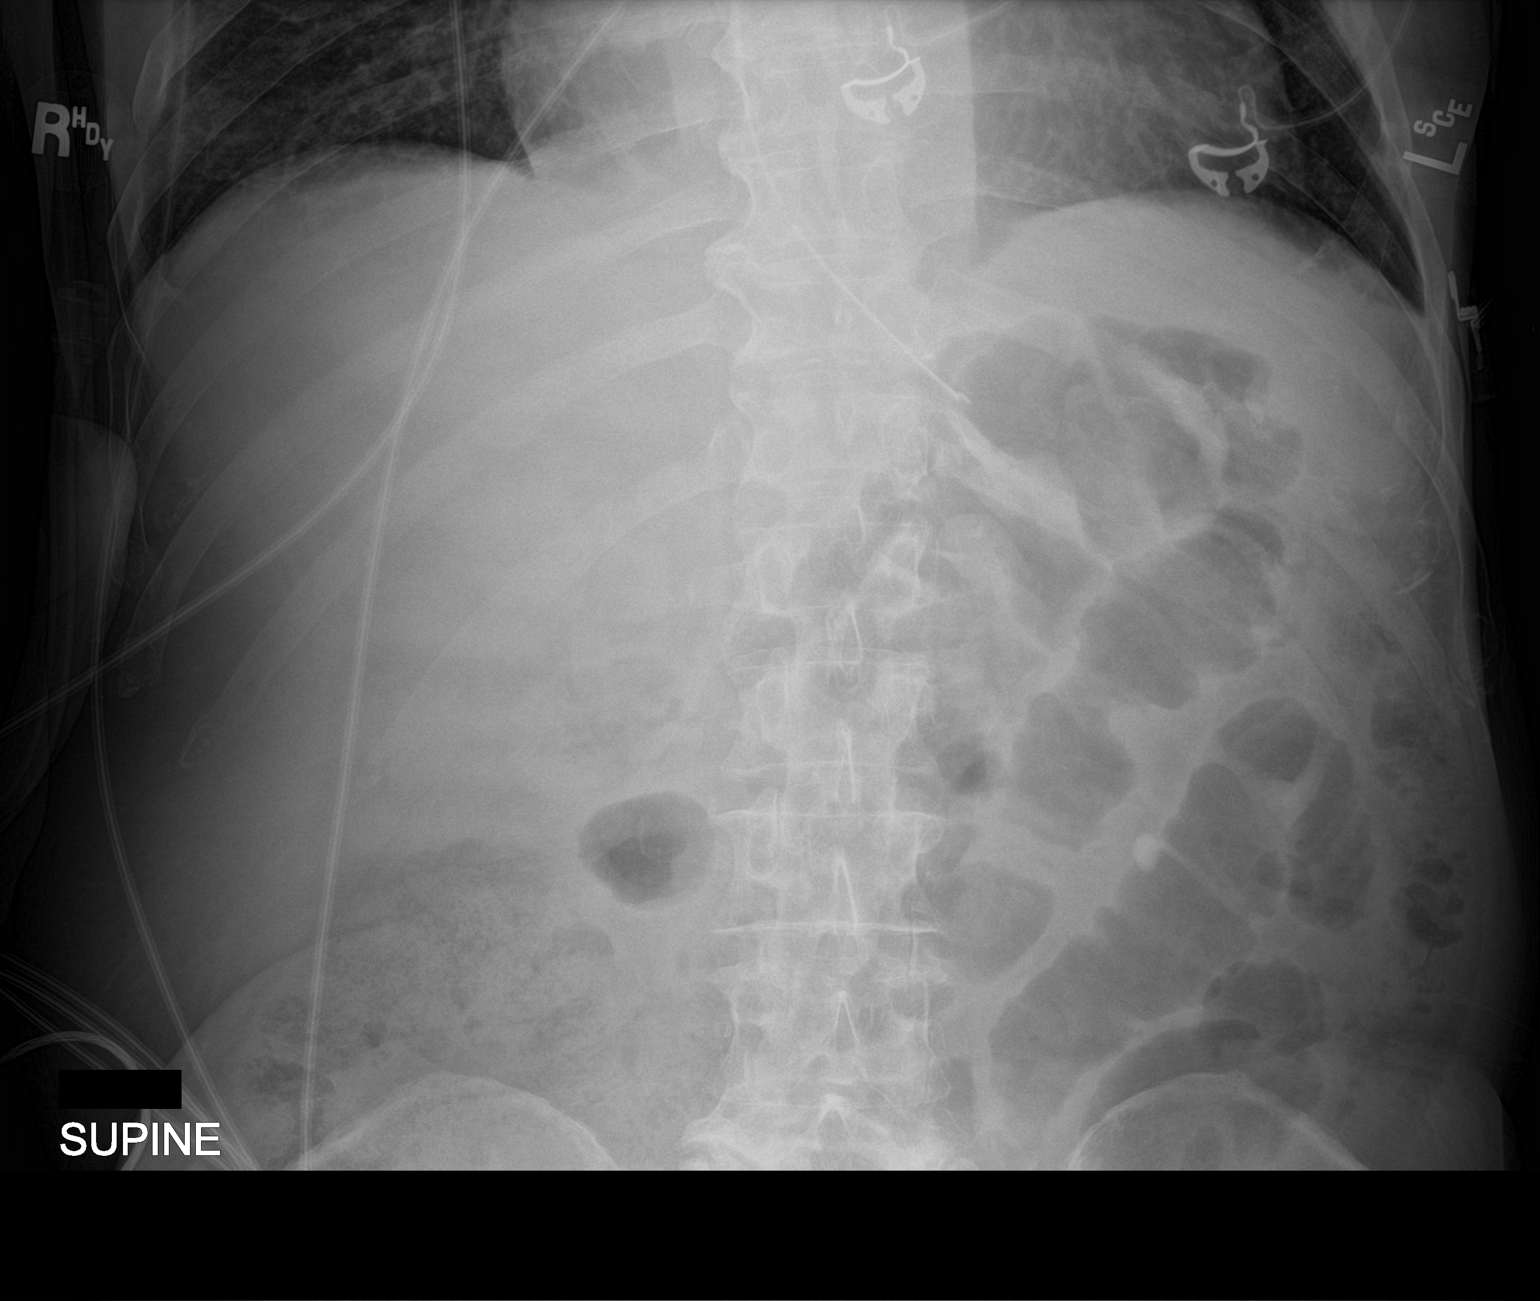

[1 of 1 positions shown; findings below may reference images not displayed]

FINDINGS: Gastric catheter lies at the level of the gastroesophageal junction.
Proximal side port is within the distal esophagus. This should be
advanced as clinically indicated. Left renal calculus is noted. No
obstructive changes are seen.
IMPRESSION: Gastric catheter at the GE junction as described.

Left renal calculus.

## 2018-01-15 IMAGING — DX DG CHEST 1V PORT
1 series · 2 of 2 positions shown · non-contrast
Comparison: 12/30/2016

CLINICAL DATA: Intubated.  Cough.

EXAM:
PORTABLE CHEST 1 VIEW

[Series 1: chest ap · 0.14mm/px · 2 of 2 slices shown]
[im 1/2]
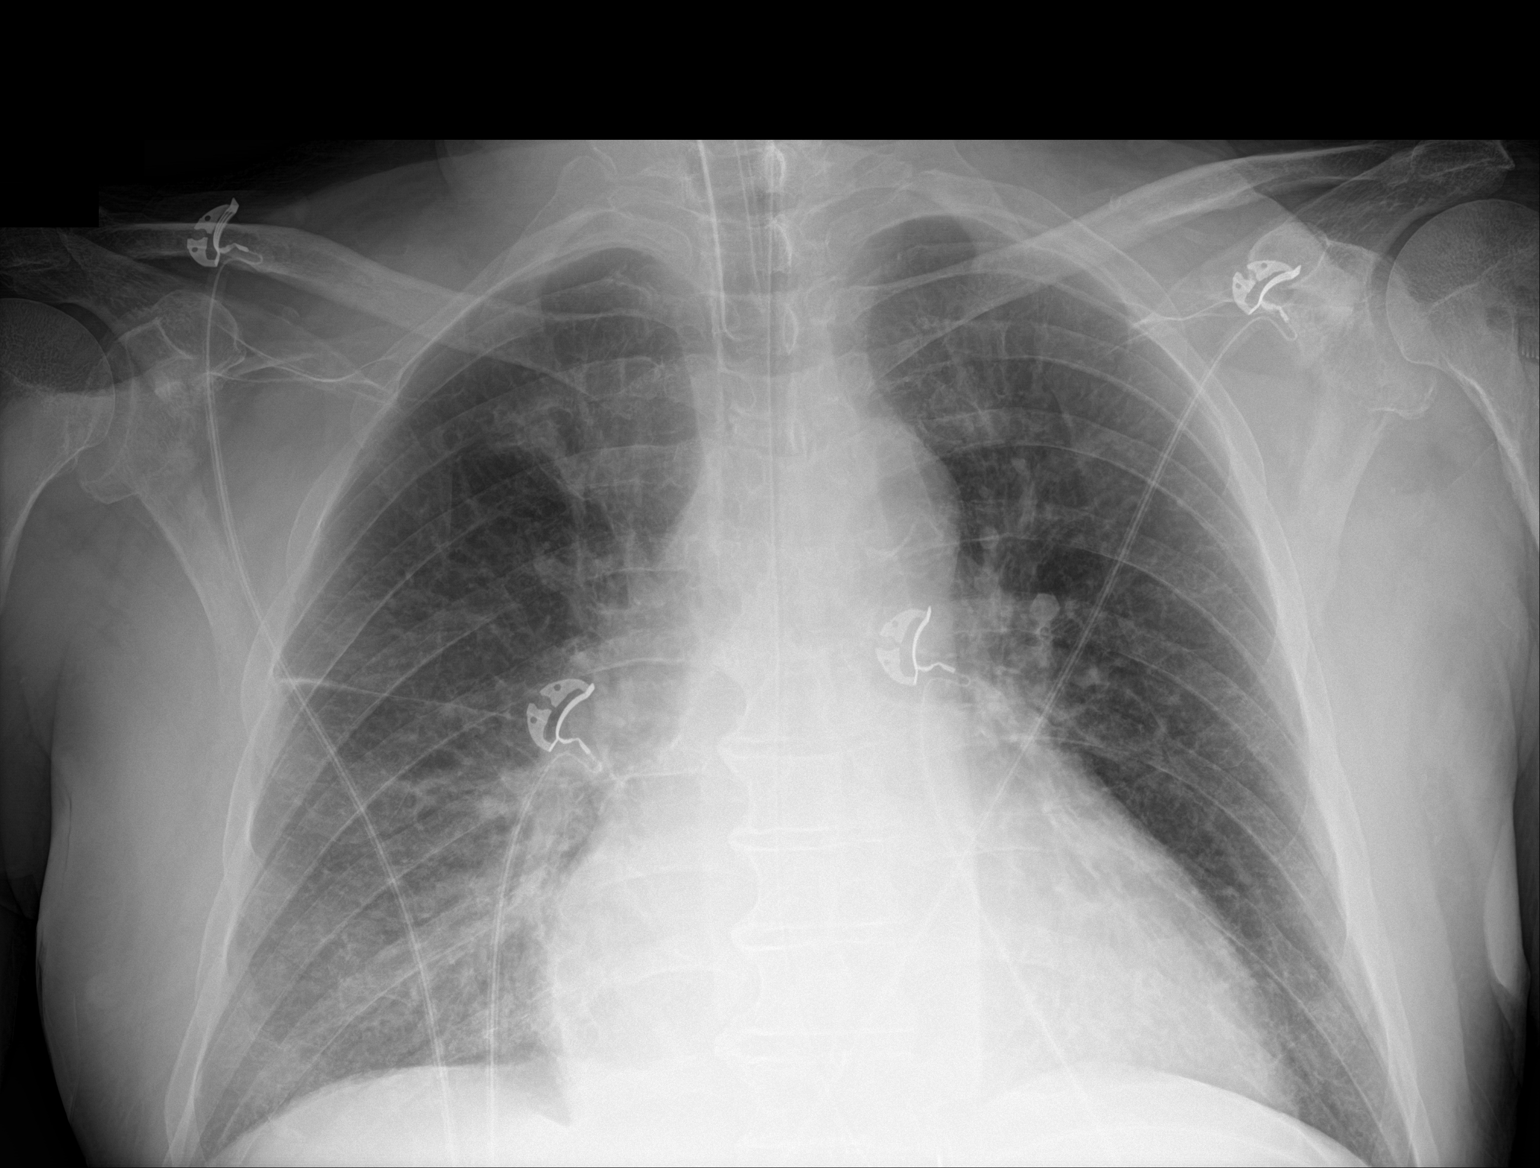
[im 2/2]
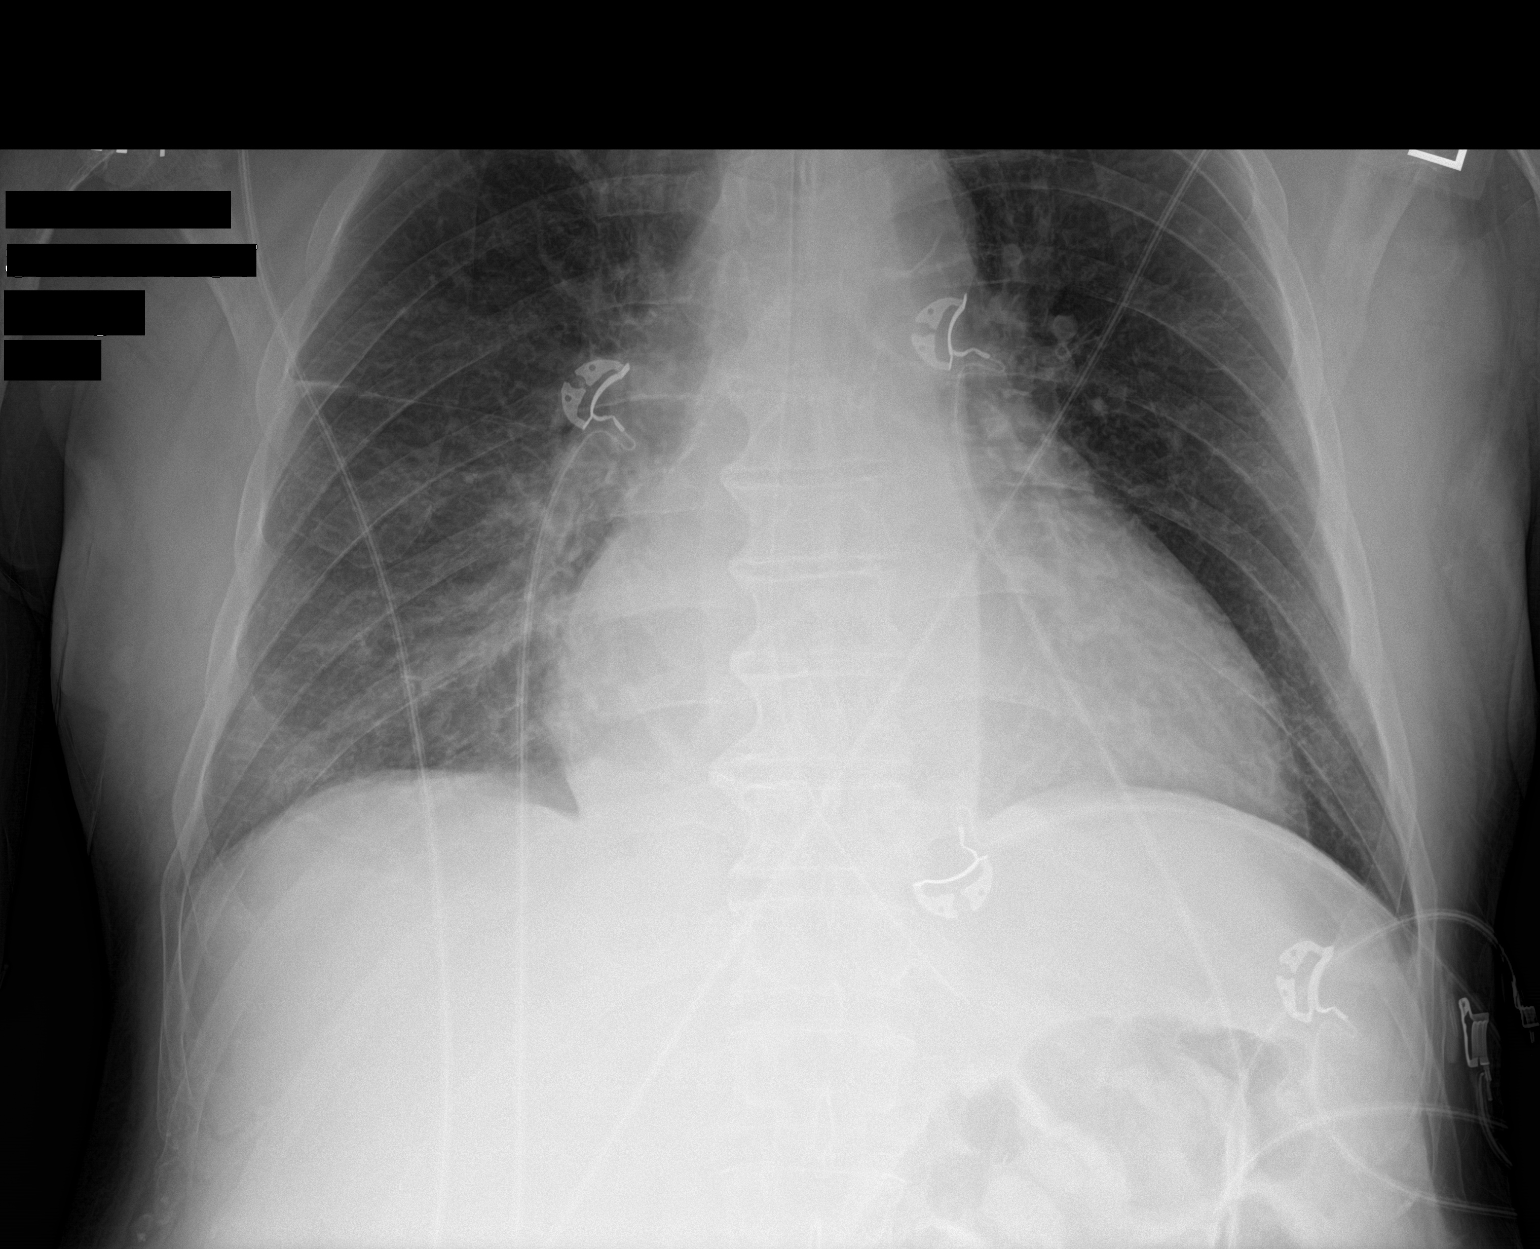

[2 of 2 positions shown; findings below may reference images not displayed]

FINDINGS: There is an endotracheal tube with the tip 5.8 cm above the carina.
There is no pleural effusion or pneumothorax. There is no focal
consolidation. There is mild cardiomegaly.

There is a nasogastric tube coursing below the diaphragm.

The osseous structures are unremarkable.
IMPRESSION: Endotracheal tube with the tip 5.8 cm above the carina.

## 2018-01-16 IMAGING — DX DG CHEST 1V PORT
1 series · 2 of 2 positions shown · non-contrast
Comparison: Prior radiograph from 12/30/2016.

CLINICAL DATA: Initial evaluation for acute respiratory failure.

EXAM:
PORTABLE CHEST 1 VIEW

[Series 1: chest ap · 0.14mm/px · 2 of 2 slices shown]
[im 1/2]
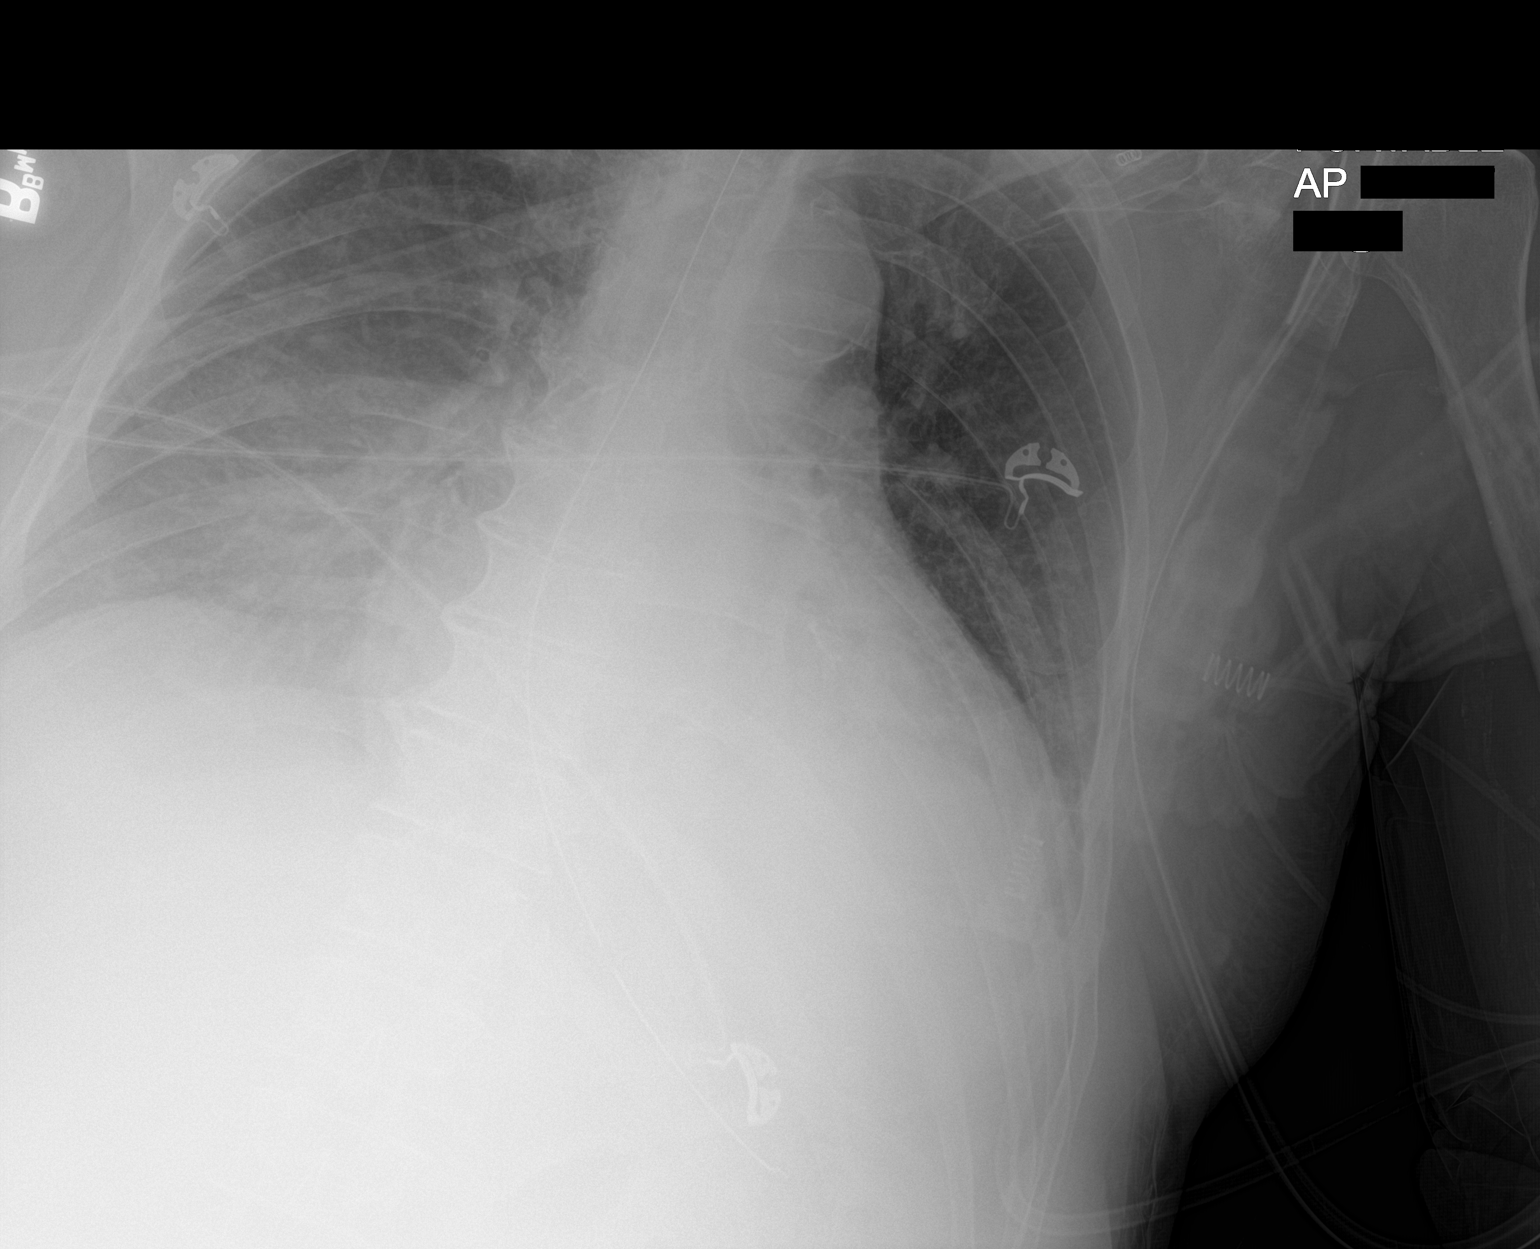
[im 2/2]
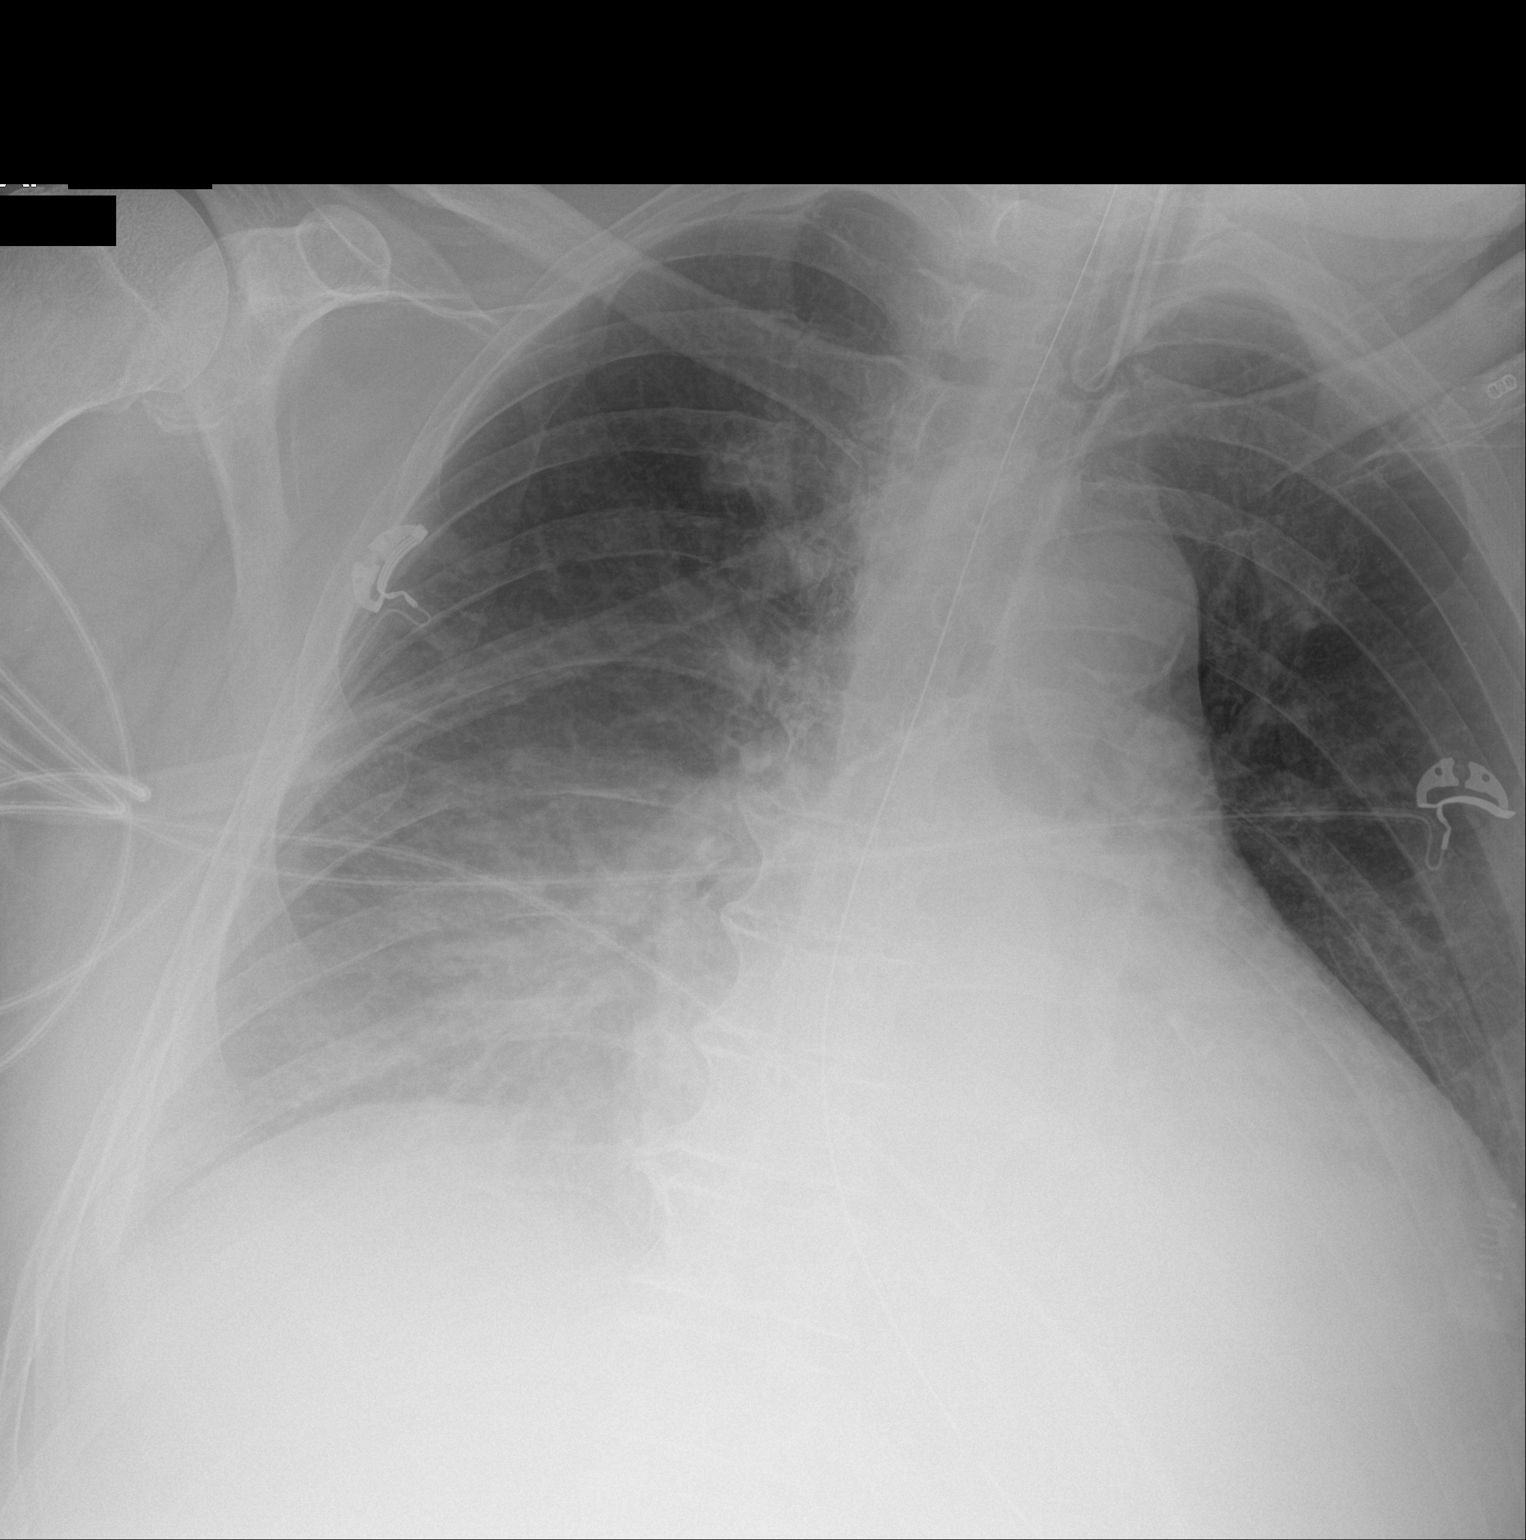

[2 of 2 positions shown; findings below may reference images not displayed]

FINDINGS: Patient remains intubated with the tip of the endotracheal tube
located at the upper margin of the clavicles. Enteric tube overlies
the stomach. Stable cardiomegaly. Mediastinal silhouette within
normal limits. Aortic atherosclerosis.

Persistent hazy and patchy bibasilar opacities, worsened on the
left, which may reflect atelectasis and/ or infiltrates. Perihilar
vascular congestion without overt pulmonary edema. Suspected small
bilateral pleural effusions. No pneumothorax.

No acute osseous abnormality.
IMPRESSION: 1. Support apparatus in satisfactory position.
2. Persistent hazy and patchy bibasilar opacities, worsened on the
left. Findings may reflect atelectasis and/or infiltrates.
3. Suspected small bilateral pleural effusions.

## 2018-01-18 IMAGING — DX DG CHEST 1V PORT
1 series · 1 of 1 positions shown · non-contrast
Comparison: 01/01/2017 chest radiograph

CLINICAL DATA: 70 y/o  M; central line placement.

EXAM:
PORTABLE CHEST 1 VIEW

[chest ap]
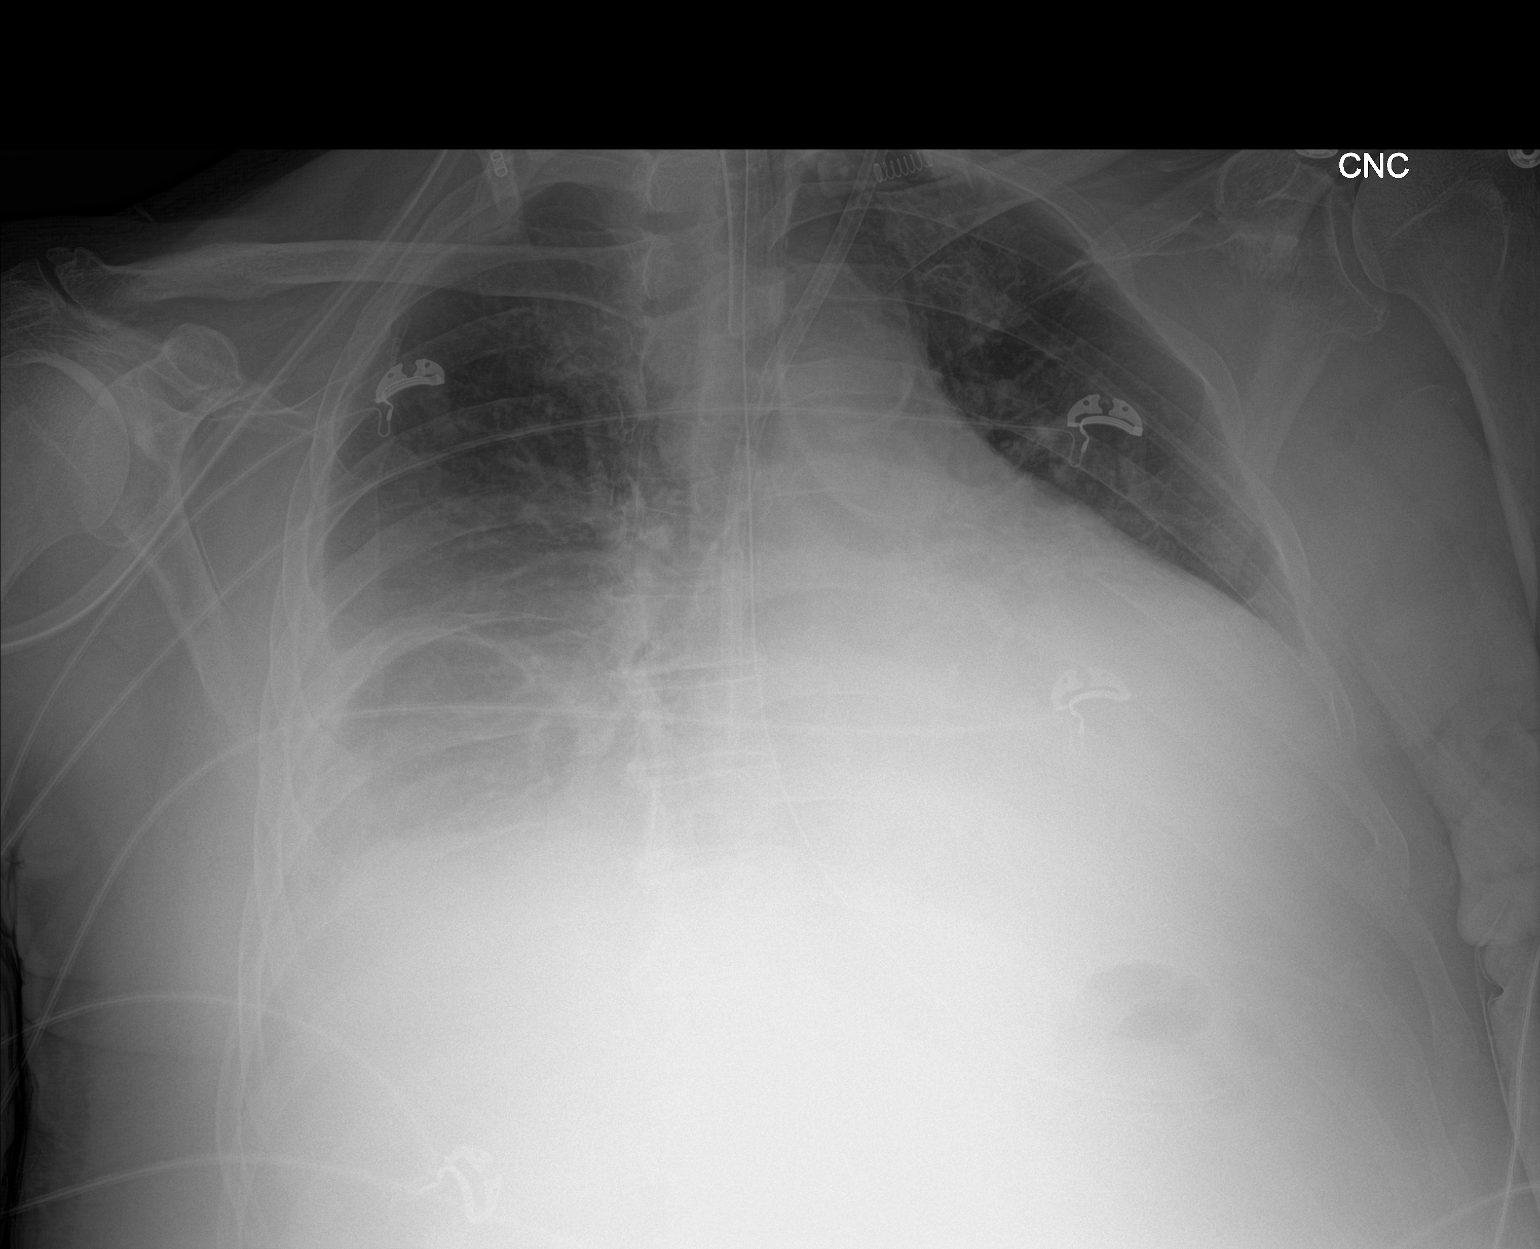

[1 of 1 positions shown; findings below may reference images not displayed]

FINDINGS: Endotracheal tube is 3 cm from the carina. Enteric tube tip projects
over the mid stomach. Left central venous catheter tip projects over
cavoatrial junction. Low lung volumes. Elevated right hemidiaphragm.
Bibasilar opacities probably representing effusion and atelectasis.
No pneumothorax. Stable cardiomegaly. Aortic atherosclerosis with
calcification.
IMPRESSION: Left central line tip projects over the cavoatrial junction. Stable
bibasilar opacities probably representing effusions and atelectasis.

By: Pablito Mead M.D.
